# Patient Record
Sex: Female | Born: 1955 | Race: Black or African American | Hispanic: No | Marital: Married | Smoking: Current every day smoker
Health system: Southern US, Community
[De-identification: ages and names within clinical notes are randomized; demographics above are authoritative.]

## PROBLEM LIST (undated history)

## (undated) DIAGNOSIS — F32A Depression, unspecified: Secondary | ICD-10-CM

## (undated) DIAGNOSIS — F1910 Other psychoactive substance abuse, uncomplicated: Secondary | ICD-10-CM

## (undated) DIAGNOSIS — I779 Disorder of arteries and arterioles, unspecified: Secondary | ICD-10-CM

## (undated) DIAGNOSIS — I10 Essential (primary) hypertension: Secondary | ICD-10-CM

## (undated) DIAGNOSIS — N183 Chronic kidney disease, stage 3 unspecified: Secondary | ICD-10-CM

## (undated) DIAGNOSIS — R531 Weakness: Secondary | ICD-10-CM

## (undated) DIAGNOSIS — R42 Dizziness and giddiness: Secondary | ICD-10-CM

## (undated) DIAGNOSIS — F419 Anxiety disorder, unspecified: Secondary | ICD-10-CM

## (undated) DIAGNOSIS — F1010 Alcohol abuse, uncomplicated: Secondary | ICD-10-CM

## (undated) DIAGNOSIS — Z720 Tobacco use: Secondary | ICD-10-CM

## (undated) DIAGNOSIS — E785 Hyperlipidemia, unspecified: Secondary | ICD-10-CM

## (undated) HISTORY — PX: ABDOMINAL HYSTERECTOMY: SHX81

## (undated) HISTORY — PX: TOTAL SHOULDER ARTHROPLASTY: SHX126

## (undated) HISTORY — PX: SHOULDER ARTHROSCOPY W/ ROTATOR CUFF REPAIR: SHX2400

## (undated) HISTORY — PX: OTHER SURGICAL HISTORY: SHX169

## (undated) HISTORY — DX: Hyperlipidemia, unspecified: E78.5

---

## 2004-06-03 ENCOUNTER — Ambulatory Visit: Payer: Self-pay

## 2005-08-19 ENCOUNTER — Other Ambulatory Visit: Payer: Self-pay

## 2005-08-19 ENCOUNTER — Emergency Department: Payer: Self-pay | Admitting: Unknown Physician Specialty

## 2005-09-14 ENCOUNTER — Emergency Department: Payer: Self-pay | Admitting: Emergency Medicine

## 2006-01-28 ENCOUNTER — Emergency Department: Payer: Self-pay | Admitting: Emergency Medicine

## 2006-03-09 ENCOUNTER — Other Ambulatory Visit: Payer: Self-pay

## 2006-03-09 ENCOUNTER — Observation Stay: Payer: Self-pay | Admitting: Internal Medicine

## 2006-03-10 ENCOUNTER — Other Ambulatory Visit: Payer: Self-pay

## 2006-04-11 ENCOUNTER — Emergency Department: Payer: Self-pay | Admitting: Emergency Medicine

## 2006-06-17 ENCOUNTER — Emergency Department: Payer: Self-pay | Admitting: Emergency Medicine

## 2006-10-23 ENCOUNTER — Other Ambulatory Visit: Payer: Self-pay

## 2006-10-23 ENCOUNTER — Emergency Department: Payer: Self-pay | Admitting: Emergency Medicine

## 2007-09-07 ENCOUNTER — Emergency Department: Payer: Self-pay | Admitting: Emergency Medicine

## 2007-09-07 ENCOUNTER — Other Ambulatory Visit: Payer: Self-pay

## 2007-11-21 ENCOUNTER — Emergency Department: Payer: Self-pay | Admitting: Internal Medicine

## 2008-05-26 ENCOUNTER — Inpatient Hospital Stay: Payer: Self-pay | Admitting: Internal Medicine

## 2008-07-09 ENCOUNTER — Ambulatory Visit: Payer: Self-pay | Admitting: Family Medicine

## 2009-08-19 ENCOUNTER — Ambulatory Visit: Payer: Self-pay | Admitting: Obstetrics and Gynecology

## 2010-02-19 ENCOUNTER — Emergency Department: Payer: Self-pay | Admitting: Unknown Physician Specialty

## 2010-11-06 ENCOUNTER — Emergency Department: Payer: Self-pay | Admitting: Emergency Medicine

## 2010-11-27 ENCOUNTER — Ambulatory Visit: Payer: Self-pay | Admitting: Surgery

## 2011-03-08 ENCOUNTER — Emergency Department: Payer: Self-pay | Admitting: Internal Medicine

## 2011-07-18 ENCOUNTER — Emergency Department: Payer: Self-pay | Admitting: Emergency Medicine

## 2011-07-18 LAB — COMPREHENSIVE METABOLIC PANEL
Albumin: 4.1 g/dL (ref 3.4–5.0)
Anion Gap: 2 — ABNORMAL LOW (ref 7–16)
BUN: 16 mg/dL (ref 7–18)
Bilirubin,Total: 0.3 mg/dL (ref 0.2–1.0)
Calcium, Total: 9.5 mg/dL (ref 8.5–10.1)
Chloride: 109 mmol/L — ABNORMAL HIGH (ref 98–107)
Creatinine: 0.64 mg/dL (ref 0.60–1.30)
Glucose: 95 mg/dL (ref 65–99)
Osmolality: 280 (ref 275–301)
Potassium: 4.9 mmol/L (ref 3.5–5.1)
SGOT(AST): 15 U/L (ref 15–37)
SGPT (ALT): 15 U/L
Sodium: 140 mmol/L (ref 136–145)
Total Protein: 7.8 g/dL (ref 6.4–8.2)

## 2011-07-18 LAB — CBC
MCH: 32 pg (ref 26.0–34.0)
MCHC: 33.7 g/dL (ref 32.0–36.0)
MCV: 95 fL (ref 80–100)
Platelet: 247 10*3/uL (ref 150–440)
RBC: 4.57 10*6/uL (ref 3.80–5.20)
WBC: 6.9 10*3/uL (ref 3.6–11.0)

## 2011-07-18 LAB — TROPONIN I: Troponin-I: <0.02 ng/mL

## 2011-09-30 ENCOUNTER — Emergency Department: Payer: Self-pay | Admitting: Emergency Medicine

## 2012-03-17 ENCOUNTER — Emergency Department: Payer: Self-pay | Admitting: Emergency Medicine

## 2012-06-16 ENCOUNTER — Emergency Department: Payer: Self-pay | Admitting: Internal Medicine

## 2012-06-25 ENCOUNTER — Inpatient Hospital Stay: Payer: Self-pay | Admitting: Psychiatry

## 2012-06-25 LAB — DRUG SCREEN, URINE
Amphetamines, Ur Screen: NEGATIVE (ref ?–1000)
Cocaine Metabolite,Ur ~~LOC~~: NEGATIVE (ref ?–300)
MDMA (Ecstasy)Ur Screen: NEGATIVE (ref ?–500)
Methadone, Ur Screen: NEGATIVE (ref ?–300)
Opiate, Ur Screen: POSITIVE (ref ?–300)
Phencyclidine (PCP) Ur S: NEGATIVE (ref ?–25)
Tricyclic, Ur Screen: NEGATIVE (ref ?–1000)

## 2012-06-25 LAB — CBC
HCT: 42.2 % (ref 35.0–47.0)
HGB: 14.5 g/dL (ref 12.0–16.0)
MCV: 92 fL (ref 80–100)
Platelet: 281 10*3/uL (ref 150–440)
RBC: 4.6 10*6/uL (ref 3.80–5.20)
WBC: 8.5 10*3/uL (ref 3.6–11.0)

## 2012-06-25 LAB — COMPREHENSIVE METABOLIC PANEL
Alkaline Phosphatase: 77 U/L (ref 50–136)
Anion Gap: 5 — ABNORMAL LOW (ref 7–16)
BUN: 8 mg/dL (ref 7–18)
Co2: 28 mmol/L (ref 21–32)
EGFR (Non-African Amer.): >60
Glucose: 96 mg/dL (ref 65–99)
Osmolality: 278 (ref 275–301)
SGOT(AST): 21 U/L (ref 15–37)
SGPT (ALT): 10 U/L — ABNORMAL LOW (ref 12–78)

## 2012-06-25 LAB — SALICYLATE LEVEL
Salicylates, Serum: 6.4 mg/dL — ABNORMAL HIGH
Salicylates, Serum: 6.2 mg/dL — ABNORMAL HIGH

## 2012-06-25 LAB — ETHANOL
Ethanol %: <0.003 % (ref 0.000–0.080)
Ethanol: <3 mg/dL

## 2012-06-25 LAB — URINALYSIS, COMPLETE
Bilirubin,UR: NEGATIVE
Leukocyte Esterase: NEGATIVE
Protein: NEGATIVE
RBC,UR: 3 /HPF (ref 0–5)
Specific Gravity: 1.015 (ref 1.003–1.030)
WBC UR: 2 /HPF (ref 0–5)

## 2012-06-25 LAB — TSH: Thyroid Stimulating Horm: 3.14 u[IU]/mL

## 2012-06-25 LAB — ACETAMINOPHEN LEVEL: Acetaminophen: 8 ug/mL — ABNORMAL LOW

## 2012-11-18 ENCOUNTER — Emergency Department: Payer: Self-pay | Admitting: Emergency Medicine

## 2012-12-21 ENCOUNTER — Observation Stay: Payer: Self-pay | Admitting: Internal Medicine

## 2012-12-21 LAB — COMPREHENSIVE METABOLIC PANEL WITH GFR
Albumin: 4.2 g/dL
Alkaline Phosphatase: 66 U/L
Anion Gap: 4 — ABNORMAL LOW
BUN: 10 mg/dL
Bilirubin,Total: 0.5 mg/dL
Calcium, Total: 10 mg/dL
Chloride: 106 mmol/L
Co2: 27 mmol/L
Creatinine: 0.73 mg/dL
EGFR (African American): >60
EGFR (Non-African Amer.): >60
Glucose: 157 mg/dL — ABNORMAL HIGH
Osmolality: 276
Potassium: 3.5 mmol/L
SGOT(AST): 22 U/L
SGPT (ALT): 14 U/L
Sodium: 137 mmol/L
Total Protein: 7.8 g/dL

## 2012-12-21 LAB — URINALYSIS, COMPLETE
Bacteria: NONE SEEN
Bilirubin,UR: NEGATIVE
Glucose,UR: NEGATIVE mg/dL
Ketone: NEGATIVE
Leukocyte Esterase: NEGATIVE
Nitrite: NEGATIVE
Ph: 5
Protein: NEGATIVE
RBC,UR: 1 /HPF
Specific Gravity: 1.012
Squamous Epithelial: 1
WBC UR: 1 /HPF

## 2012-12-21 LAB — PROTIME-INR
INR: 0.9
Prothrombin Time: 12.5 s

## 2012-12-21 LAB — APTT: Activated PTT: 33 secs (ref 23.6–35.9)

## 2012-12-21 LAB — CBC
HCT: 43 %
HGB: 15.1 g/dL
MCH: 32.3 pg
MCHC: 35.2 g/dL
MCV: 92 fL
Platelet: 260 x10 3/mm 3
RBC: 4.7 X10 6/mm 3
RDW: 15.7 % — ABNORMAL HIGH
WBC: 7.5 x10 3/mm 3

## 2012-12-21 LAB — SEDIMENTATION RATE: Erythrocyte Sed Rate: 9 mm/h

## 2012-12-21 LAB — TROPONIN I: Troponin-I: <0.02 ng/mL

## 2012-12-22 DIAGNOSIS — I6350 Cerebral infarction due to unspecified occlusion or stenosis of unspecified cerebral artery: Secondary | ICD-10-CM

## 2012-12-22 LAB — LIPID PANEL
Cholesterol: 244 mg/dL — ABNORMAL HIGH (ref 0–200)
HDL Cholesterol: 71 mg/dL — ABNORMAL HIGH (ref 40–60)
Triglycerides: 74 mg/dL (ref 0–200)
VLDL Cholesterol, Calc: 15 mg/dL (ref 5–40)

## 2012-12-22 LAB — CBC WITH DIFFERENTIAL/PLATELET
Basophil #: 0 10*3/uL (ref 0.0–0.1)
Basophil %: 0.4 %
Eosinophil %: 1.8 %
HCT: 41.7 % (ref 35.0–47.0)
HGB: 14.4 g/dL (ref 12.0–16.0)
Lymphocyte #: 2.4 10*3/uL (ref 1.0–3.6)
MCH: 32 pg (ref 26.0–34.0)
MCHC: 34.5 g/dL (ref 32.0–36.0)
MCV: 93 fL (ref 80–100)
Neutrophil #: 3.4 10*3/uL (ref 1.4–6.5)
Neutrophil %: 53.5 %
Platelet: 249 10*3/uL (ref 150–440)
RDW: 15.6 % — ABNORMAL HIGH (ref 11.5–14.5)
WBC: 6.4 10*3/uL (ref 3.6–11.0)

## 2012-12-22 LAB — BASIC METABOLIC PANEL
Anion Gap: 6 — ABNORMAL LOW (ref 7–16)
BUN: 10 mg/dL (ref 7–18)
EGFR (Non-African Amer.): >60
Potassium: 3.8 mmol/L (ref 3.5–5.1)
Sodium: 138 mmol/L (ref 136–145)

## 2012-12-22 LAB — MAGNESIUM: Magnesium: 1.7 mg/dL — ABNORMAL LOW

## 2012-12-22 LAB — TSH: Thyroid Stimulating Horm: 1.85 u[IU]/mL

## 2012-12-22 LAB — HEMOGLOBIN A1C: Hemoglobin A1C: 4.9 % (ref 4.2–6.3)

## 2013-03-05 ENCOUNTER — Emergency Department: Payer: Self-pay | Admitting: Emergency Medicine

## 2013-03-05 LAB — URINALYSIS, COMPLETE
Bilirubin,UR: NEGATIVE
Glucose,UR: NEGATIVE mg/dL (ref 0–75)
Nitrite: NEGATIVE
Ph: 6 (ref 4.5–8.0)
Protein: NEGATIVE
RBC,UR: 4 /HPF (ref 0–5)
Squamous Epithelial: 2
WBC UR: 8 /HPF (ref 0–5)

## 2013-03-05 LAB — COMPREHENSIVE METABOLIC PANEL
Albumin: 4.2 g/dL (ref 3.4–5.0)
Alkaline Phosphatase: 67 U/L
Calcium, Total: 9.2 mg/dL (ref 8.5–10.1)
Chloride: 104 mmol/L (ref 98–107)
Co2: 33 mmol/L — ABNORMAL HIGH (ref 21–32)
Creatinine: 0.84 mg/dL (ref 0.60–1.30)
EGFR (African American): >60
EGFR (Non-African Amer.): >60
Osmolality: 271 (ref 275–301)
Potassium: 3.9 mmol/L (ref 3.5–5.1)
SGOT(AST): 32 U/L (ref 15–37)
SGPT (ALT): 30 U/L (ref 12–78)
Total Protein: 7.9 g/dL (ref 6.4–8.2)

## 2013-03-05 LAB — CBC WITH DIFFERENTIAL/PLATELET
Basophil #: 0 10*3/uL (ref 0.0–0.1)
Basophil %: 0.7 %
Eosinophil #: 0.1 10*3/uL (ref 0.0–0.7)
Eosinophil %: 1.8 %
HCT: 40.7 % (ref 35.0–47.0)
HGB: 14 g/dL (ref 12.0–16.0)
Lymphocyte #: 2.4 10*3/uL (ref 1.0–3.6)
MCH: 32.2 pg (ref 26.0–34.0)
MCHC: 34.4 g/dL (ref 32.0–36.0)
MCV: 94 fL (ref 80–100)
Monocyte %: 3.7 %
Neutrophil %: 61.4 %
Platelet: 292 10*3/uL (ref 150–440)

## 2013-05-31 ENCOUNTER — Emergency Department: Payer: Self-pay | Admitting: Internal Medicine

## 2013-05-31 LAB — COMPREHENSIVE METABOLIC PANEL
ALK PHOS: 78 U/L
ANION GAP: 5 — AB (ref 7–16)
AST: 11 U/L — AB (ref 15–37)
Albumin: 3.8 g/dL (ref 3.4–5.0)
BILIRUBIN TOTAL: 0.6 mg/dL (ref 0.2–1.0)
BUN: 11 mg/dL (ref 7–18)
Calcium, Total: 9.4 mg/dL (ref 8.5–10.1)
Chloride: 103 mmol/L (ref 98–107)
Co2: 28 mmol/L (ref 21–32)
Creatinine: 0.87 mg/dL (ref 0.60–1.30)
EGFR (African American): >60
EGFR (Non-African Amer.): >60
Glucose: 87 mg/dL (ref 65–99)
OSMOLALITY: 271 (ref 275–301)
Potassium: 3.4 mmol/L — ABNORMAL LOW (ref 3.5–5.1)
SGPT (ALT): 13 U/L (ref 12–78)
SODIUM: 136 mmol/L (ref 136–145)
Total Protein: 8.2 g/dL (ref 6.4–8.2)

## 2013-05-31 LAB — URINALYSIS, COMPLETE
Bilirubin,UR: NEGATIVE
Glucose,UR: NEGATIVE mg/dL (ref 0–75)
Nitrite: NEGATIVE
Ph: 5 (ref 4.5–8.0)
RBC,UR: 64 /HPF (ref 0–5)
Specific Gravity: 1.016 (ref 1.003–1.030)

## 2013-05-31 LAB — CBC
HCT: 42.4 % (ref 35.0–47.0)
HGB: 14.3 g/dL (ref 12.0–16.0)
MCH: 31.3 pg (ref 26.0–34.0)
MCHC: 33.7 g/dL (ref 32.0–36.0)
MCV: 93 fL (ref 80–100)
Platelet: 253 10*3/uL (ref 150–440)
RBC: 4.57 10*6/uL (ref 3.80–5.20)
RDW: 15.1 % — AB (ref 11.5–14.5)
WBC: 8.7 10*3/uL (ref 3.6–11.0)

## 2013-06-07 ENCOUNTER — Emergency Department: Payer: Self-pay | Admitting: Emergency Medicine

## 2013-06-07 LAB — URINALYSIS, COMPLETE
Bilirubin,UR: NEGATIVE
Blood: NEGATIVE
GLUCOSE, UR: NEGATIVE mg/dL (ref 0–75)
KETONE: NEGATIVE
Leukocyte Esterase: NEGATIVE
NITRITE: NEGATIVE
PH: 5 (ref 4.5–8.0)
Protein: NEGATIVE
RBC,UR: 1 /HPF (ref 0–5)
SPECIFIC GRAVITY: 1.01 (ref 1.003–1.030)
Squamous Epithelial: 2

## 2013-06-07 LAB — COMPREHENSIVE METABOLIC PANEL
ALBUMIN: 3.7 g/dL (ref 3.4–5.0)
ALT: 13 U/L (ref 12–78)
ANION GAP: 6 — AB (ref 7–16)
Alkaline Phosphatase: 78 U/L
BILIRUBIN TOTAL: 0.3 mg/dL (ref 0.2–1.0)
BUN: 16 mg/dL (ref 7–18)
CO2: 27 mmol/L (ref 21–32)
CREATININE: 0.87 mg/dL (ref 0.60–1.30)
Calcium, Total: 9.4 mg/dL (ref 8.5–10.1)
Chloride: 104 mmol/L (ref 98–107)
EGFR (African American): >60
Glucose: 78 mg/dL (ref 65–99)
OSMOLALITY: 274 (ref 275–301)
Potassium: 4.1 mmol/L (ref 3.5–5.1)
SGOT(AST): 14 U/L — ABNORMAL LOW (ref 15–37)
Sodium: 137 mmol/L (ref 136–145)
Total Protein: 7.9 g/dL (ref 6.4–8.2)

## 2013-06-07 LAB — CBC
HCT: 40.5 % (ref 35.0–47.0)
HGB: 13.6 g/dL (ref 12.0–16.0)
MCH: 31.1 pg (ref 26.0–34.0)
MCHC: 33.7 g/dL (ref 32.0–36.0)
MCV: 92 fL (ref 80–100)
Platelet: 307 10*3/uL (ref 150–440)
RBC: 4.38 10*6/uL (ref 3.80–5.20)
RDW: 14.7 % — ABNORMAL HIGH (ref 11.5–14.5)
WBC: 8.2 10*3/uL (ref 3.6–11.0)

## 2013-07-28 ENCOUNTER — Emergency Department: Payer: Self-pay | Admitting: Emergency Medicine

## 2013-07-28 ENCOUNTER — Inpatient Hospital Stay (HOSPITAL_COMMUNITY): Admission: AD | Admit: 2013-07-28 | Payer: Self-pay | Admitting: Psychiatry

## 2013-07-28 LAB — URINALYSIS, COMPLETE
Bilirubin,UR: NEGATIVE
GLUCOSE, UR: NEGATIVE mg/dL (ref 0–75)
Ketone: NEGATIVE
Leukocyte Esterase: NEGATIVE
NITRITE: NEGATIVE
Ph: 6 (ref 4.5–8.0)
Protein: NEGATIVE
RBC, UR: NONE SEEN /HPF (ref 0–5)
Specific Gravity: 1.003 (ref 1.003–1.030)
WBC UR: 1 /HPF (ref 0–5)

## 2013-07-28 LAB — CBC
HCT: 42.5 % (ref 35.0–47.0)
HGB: 14.1 g/dL (ref 12.0–16.0)
MCH: 30.9 pg (ref 26.0–34.0)
MCHC: 33.2 g/dL (ref 32.0–36.0)
MCV: 93 fL (ref 80–100)
PLATELETS: 245 10*3/uL (ref 150–440)
RBC: 4.56 10*6/uL (ref 3.80–5.20)
RDW: 16 % — AB (ref 11.5–14.5)
WBC: 5.8 10*3/uL (ref 3.6–11.0)

## 2013-07-28 LAB — DRUG SCREEN, URINE

## 2013-07-28 LAB — COMPREHENSIVE METABOLIC PANEL
ALK PHOS: 65 U/L
AST: 27 U/L (ref 15–37)
Albumin: 4 g/dL (ref 3.4–5.0)
Anion Gap: 6 — ABNORMAL LOW (ref 7–16)
BUN: 10 mg/dL (ref 7–18)
Bilirubin,Total: 0.3 mg/dL (ref 0.2–1.0)
CALCIUM: 9.5 mg/dL (ref 8.5–10.1)
Chloride: 106 mmol/L (ref 98–107)
Co2: 27 mmol/L (ref 21–32)
Creatinine: 0.8 mg/dL (ref 0.60–1.30)
EGFR (African American): >60
EGFR (Non-African Amer.): >60
Glucose: 79 mg/dL (ref 65–99)
Osmolality: 276 (ref 275–301)
Potassium: 3.8 mmol/L (ref 3.5–5.1)
SGPT (ALT): 15 U/L (ref 12–78)
SODIUM: 139 mmol/L (ref 136–145)
TOTAL PROTEIN: 7.6 g/dL (ref 6.4–8.2)

## 2013-07-28 LAB — TSH: Thyroid Stimulating Horm: 1.35 u[IU]/mL

## 2013-07-28 LAB — SALICYLATE LEVEL: Salicylates, Serum: 3.6 mg/dL — ABNORMAL HIGH

## 2013-07-28 LAB — ETHANOL
ETHANOL LVL: 129 mg/dL
Ethanol %: 0.129 % — ABNORMAL HIGH (ref 0.000–0.080)

## 2013-07-28 LAB — ACETAMINOPHEN LEVEL

## 2013-09-03 LAB — URINALYSIS, COMPLETE
Bacteria: NONE SEEN
Bilirubin,UR: NEGATIVE
Glucose,UR: NEGATIVE mg/dL (ref 0–75)
Ketone: NEGATIVE
LEUKOCYTE ESTERASE: NEGATIVE
Nitrite: NEGATIVE
Ph: 5 (ref 4.5–8.0)
Protein: NEGATIVE
SPECIFIC GRAVITY: 1.015 (ref 1.003–1.030)
Squamous Epithelial: 3

## 2013-09-03 LAB — PROTIME-INR
INR: 0.9
Prothrombin Time: 11.9 secs (ref 11.5–14.7)

## 2013-09-03 LAB — CBC WITH DIFFERENTIAL/PLATELET
BASOS PCT: 0.5 %
Basophil #: 0 10*3/uL (ref 0.0–0.1)
Eosinophil #: 0.2 10*3/uL (ref 0.0–0.7)
Eosinophil %: 3 %
HCT: 43.4 % (ref 35.0–47.0)
HGB: 14.9 g/dL (ref 12.0–16.0)
LYMPHS PCT: 39.2 %
Lymphocyte #: 3 10*3/uL (ref 1.0–3.6)
MCH: 31.6 pg (ref 26.0–34.0)
MCHC: 34.3 g/dL (ref 32.0–36.0)
MCV: 92 fL (ref 80–100)
Monocyte #: 0.3 x10 3/mm (ref 0.2–0.9)
Monocyte %: 3.9 %
Neutrophil #: 4.1 10*3/uL (ref 1.4–6.5)
Neutrophil %: 53.4 %
PLATELETS: 287 10*3/uL (ref 150–440)
RBC: 4.7 10*6/uL (ref 3.80–5.20)
RDW: 16.4 % — AB (ref 11.5–14.5)
WBC: 7.7 10*3/uL (ref 3.6–11.0)

## 2013-09-03 LAB — COMPREHENSIVE METABOLIC PANEL
ALK PHOS: 72 U/L
ALT: 16 U/L (ref 12–78)
ANION GAP: 9 (ref 7–16)
AST: 19 U/L (ref 15–37)
Albumin: 4.1 g/dL (ref 3.4–5.0)
BILIRUBIN TOTAL: 0.3 mg/dL (ref 0.2–1.0)
BUN: 16 mg/dL (ref 7–18)
CALCIUM: 9.5 mg/dL (ref 8.5–10.1)
Chloride: 105 mmol/L (ref 98–107)
Co2: 24 mmol/L (ref 21–32)
Creatinine: 0.69 mg/dL (ref 0.60–1.30)
EGFR (Non-African Amer.): >60
Glucose: 94 mg/dL (ref 65–99)
Osmolality: 277 (ref 275–301)
POTASSIUM: 3.3 mmol/L — AB (ref 3.5–5.1)
SODIUM: 138 mmol/L (ref 136–145)
Total Protein: 7.9 g/dL (ref 6.4–8.2)

## 2013-09-03 LAB — ETHANOL
Ethanol %: 0.07 % (ref 0.000–0.080)
Ethanol: 70 mg/dL

## 2013-09-03 LAB — SALICYLATE LEVEL: Salicylates, Serum: 5.8 mg/dL — ABNORMAL HIGH

## 2013-09-03 LAB — TROPONIN I: Troponin-I: <0.02 ng/mL

## 2013-09-04 ENCOUNTER — Observation Stay: Payer: Self-pay | Admitting: Internal Medicine

## 2013-09-04 LAB — DRUG SCREEN, URINE
Amphetamines, Ur Screen: NEGATIVE (ref ?–1000)
BARBITURATES, UR SCREEN: NEGATIVE (ref ?–200)
Benzodiazepine, Ur Scrn: NEGATIVE (ref ?–200)
Cannabinoid 50 Ng, Ur ~~LOC~~: NEGATIVE (ref ?–50)
Cocaine Metabolite,Ur ~~LOC~~: NEGATIVE (ref ?–300)
MDMA (ECSTASY) UR SCREEN: NEGATIVE (ref ?–500)
Methadone, Ur Screen: NEGATIVE (ref ?–300)
Opiate, Ur Screen: NEGATIVE (ref ?–300)
PHENCYCLIDINE (PCP) UR S: NEGATIVE (ref ?–25)
Tricyclic, Ur Screen: NEGATIVE (ref ?–1000)

## 2013-09-04 LAB — TSH: THYROID STIMULATING HORM: 2.6 u[IU]/mL

## 2013-09-04 LAB — ACETAMINOPHEN LEVEL

## 2014-01-20 LAB — CBC
HCT: 46.9 % (ref 35.0–47.0)
HGB: 15.6 g/dL (ref 12.0–16.0)
MCH: 32.5 pg (ref 26.0–34.0)
MCHC: 33.2 g/dL (ref 32.0–36.0)
MCV: 98 fL (ref 80–100)
Platelet: 243 10*3/uL (ref 150–440)
RBC: 4.8 10*6/uL (ref 3.80–5.20)
RDW: 16.3 % — AB (ref 11.5–14.5)
WBC: 5.5 10*3/uL (ref 3.6–11.0)

## 2014-01-20 LAB — COMPREHENSIVE METABOLIC PANEL
ALK PHOS: 53 U/L
AST: 32 U/L (ref 15–37)
Albumin: 4.2 g/dL (ref 3.4–5.0)
Anion Gap: 6 — ABNORMAL LOW (ref 7–16)
BUN: 9 mg/dL (ref 7–18)
Bilirubin,Total: 0.2 mg/dL (ref 0.2–1.0)
CHLORIDE: 107 mmol/L (ref 98–107)
CO2: 24 mmol/L (ref 21–32)
Calcium, Total: 9.3 mg/dL (ref 8.5–10.1)
Creatinine: 0.69 mg/dL (ref 0.60–1.30)
EGFR (African American): >60
EGFR (Non-African Amer.): >60
GLUCOSE: 78 mg/dL (ref 65–99)
OSMOLALITY: 271 (ref 275–301)
Potassium: 3.6 mmol/L (ref 3.5–5.1)
SGPT (ALT): 27 U/L
Sodium: 137 mmol/L (ref 136–145)
Total Protein: 8 g/dL (ref 6.4–8.2)

## 2014-01-20 LAB — URINALYSIS, COMPLETE
BILIRUBIN, UR: NEGATIVE
Bacteria: NONE SEEN
Glucose,UR: NEGATIVE mg/dL (ref 0–75)
Ketone: NEGATIVE
Nitrite: NEGATIVE
PH: 5 (ref 4.5–8.0)
Protein: NEGATIVE
RBC,UR: 2 /HPF (ref 0–5)
SPECIFIC GRAVITY: 1.011 (ref 1.003–1.030)
WBC UR: 7 /HPF (ref 0–5)

## 2014-01-20 LAB — ACETAMINOPHEN LEVEL: Acetaminophen: <2 ug/mL

## 2014-01-20 LAB — DRUG SCREEN, URINE
AMPHETAMINES, UR SCREEN: NEGATIVE (ref ?–1000)
BARBITURATES, UR SCREEN: NEGATIVE (ref ?–200)
Benzodiazepine, Ur Scrn: NEGATIVE (ref ?–200)
CANNABINOID 50 NG, UR ~~LOC~~: POSITIVE (ref ?–50)
COCAINE METABOLITE, UR ~~LOC~~: NEGATIVE (ref ?–300)
MDMA (ECSTASY) UR SCREEN: NEGATIVE (ref ?–500)
Methadone, Ur Screen: NEGATIVE (ref ?–300)
Opiate, Ur Screen: NEGATIVE (ref ?–300)
Phencyclidine (PCP) Ur S: NEGATIVE (ref ?–25)
Tricyclic, Ur Screen: NEGATIVE (ref ?–1000)

## 2014-01-20 LAB — SALICYLATE LEVEL: Salicylates, Serum: 6.9 mg/dL — ABNORMAL HIGH

## 2014-01-20 LAB — ETHANOL: ETHANOL LVL: 18 mg/dL

## 2014-01-21 ENCOUNTER — Inpatient Hospital Stay: Payer: Self-pay | Admitting: Psychiatry

## 2014-03-21 LAB — URINALYSIS, COMPLETE
BILIRUBIN, UR: NEGATIVE
Bacteria: NONE SEEN
Glucose,UR: NEGATIVE mg/dL (ref 0–75)
Ketone: NEGATIVE
LEUKOCYTE ESTERASE: NEGATIVE
Nitrite: NEGATIVE
Ph: 5 (ref 4.5–8.0)
Protein: NEGATIVE
Specific Gravity: 1.001 (ref 1.003–1.030)
Squamous Epithelial: <1
WBC UR: NONE SEEN /HPF (ref 0–5)

## 2014-03-21 LAB — COMPREHENSIVE METABOLIC PANEL
ANION GAP: 8 (ref 7–16)
Albumin: 3.8 g/dL (ref 3.4–5.0)
Alkaline Phosphatase: 68 U/L
BUN: 8 mg/dL (ref 7–18)
Bilirubin,Total: 0.5 mg/dL (ref 0.2–1.0)
CALCIUM: 8.9 mg/dL (ref 8.5–10.1)
CHLORIDE: 104 mmol/L (ref 98–107)
Co2: 23 mmol/L (ref 21–32)
Creatinine: 0.76 mg/dL (ref 0.60–1.30)
EGFR (Non-African Amer.): >60
Glucose: 72 mg/dL (ref 65–99)
OSMOLALITY: 267 (ref 275–301)
POTASSIUM: 4 mmol/L (ref 3.5–5.1)
SGOT(AST): 30 U/L (ref 15–37)
SGPT (ALT): 17 U/L
Sodium: 135 mmol/L — ABNORMAL LOW (ref 136–145)
Total Protein: 7.4 g/dL (ref 6.4–8.2)

## 2014-03-21 LAB — CBC
HCT: 45 % (ref 35.0–47.0)
HGB: 15.1 g/dL (ref 12.0–16.0)
MCH: 32.4 pg (ref 26.0–34.0)
MCHC: 33.5 g/dL (ref 32.0–36.0)
MCV: 97 fL (ref 80–100)
PLATELETS: 269 10*3/uL (ref 150–440)
RBC: 4.66 10*6/uL (ref 3.80–5.20)
RDW: 15.7 % — AB (ref 11.5–14.5)
WBC: 7.1 10*3/uL (ref 3.6–11.0)

## 2014-03-21 LAB — DRUG SCREEN, URINE
Amphetamines, Ur Screen: NEGATIVE (ref ?–1000)
BARBITURATES, UR SCREEN: NEGATIVE (ref ?–200)
Benzodiazepine, Ur Scrn: NEGATIVE (ref ?–200)
CANNABINOID 50 NG, UR ~~LOC~~: POSITIVE (ref ?–50)
COCAINE METABOLITE, UR ~~LOC~~: NEGATIVE (ref ?–300)
MDMA (Ecstasy)Ur Screen: NEGATIVE (ref ?–500)
Methadone, Ur Screen: NEGATIVE (ref ?–300)
Opiate, Ur Screen: NEGATIVE (ref ?–300)
PHENCYCLIDINE (PCP) UR S: NEGATIVE (ref ?–25)
Tricyclic, Ur Screen: NEGATIVE (ref ?–1000)

## 2014-03-21 LAB — SALICYLATE LEVEL: SALICYLATES, SERUM: 6.2 mg/dL — AB

## 2014-03-21 LAB — ACETAMINOPHEN LEVEL: Acetaminophen: <2 ug/mL

## 2014-03-21 LAB — ETHANOL: ETHANOL LVL: 192 mg/dL

## 2014-03-22 ENCOUNTER — Inpatient Hospital Stay: Payer: Self-pay | Admitting: Psychiatry

## 2014-06-27 NOTE — Discharge Summary (Signed)
PATIENT NAME:  Erin Lewis, Erin Lewis MR#:  [id number] DATE OF BIRTH:  12/08/1955  DATE OF ADMISSION:  12/21/2012 DATE OF DISCHARGE:  12/23/2012  DISCHARGE DIAGNOSES:  1. Left side weakness, numbness, resolved.  2. Hypertension.  3. Hyperlipidemia.   DISCHARGE MEDICATIONS: Atorvastatin  20 mg p.o. daily, aspirin 325 mg p.o. daily, hydrochlorothiazide 25 mg p.o. daily. Norvasc 5 mg daily.   DIET: Low-sodium, low-fat diet.   CONSULTATIONS: None.   DISCHARGE: Home with home physical therapy.   HOSPITAL COURSE: The patient is a 59 year old female patient admitted because of left facial numbness and left-sided weakness. The patient had no dysphasia or slurred speech, had some headache. Blood pressure on admission 206/96, but decreased to 188/90 without treatment. She has a history of tobacco abuse of 20 years. The patient admitted for stroke evaluation. Initial CAT scan of the head is normal. Neurologic examination was normal, except some slight weakness on the left side. The patient had an MRI of the brain and carotid ultrasound and echocardiogram and lipid panel. Her LDL is 158. Echocardiogram showed EF of 60% to 65% with normal LV function. Hemoglobin A1c 4.9. MRI of the brain showed no abnormal signal to suggest any infarct. MRI is normal. The patient also had carotid ultrasound, which showed small amount of soft calcified plaque in carotid systems. No evidence of hemodynamically significant stenosis. The patient has CT head not showing any acute changes. CBC, BMP were normal. Troponins have been negative. The patient seen by physical therapy, they recommended 24-hour help.  The patient lives with her husband. The patient was seen by physical therapy and speech therapy. Speech therapy did not recommend any changes in her diet. Physical therapy recommended home with 24-hour assist. The patient says that she has a walker and cane at home. I told her that she needs to be careful about her fall risk,  and we are  going to arrange physical therapy at home.  Of note, her blood pressure was elevated yesterday afternoon and we could not arrange home physical therapy. Discharge was cancelled.  At that time, blood pressure was 223/105, which improved with Ativan.pt most likely had anixiety attack. The patient did not have to have any extra dose of hydralazine. The patient was given a dose of Ativan 0.5 and also nitroglycerin patch was started, 2 inches b.i.d., and the patient denies any complaints. The patient had a repeat of the CAT scan of the head yesterday in the evening because of numbness of left side of face, which resolved on its own. The patient's blood pressure this morning is 126/74, pulse is 103. Respirations were 18. Temperature 97.6. The patient was advised to quit smoking.     ____________________________ Epifanio Lesches, MD sk:sg D: 12/23/2012 09:18:43 ET T: 12/23/2012 10:51:12 ET JOB#: [id number]  cc: Epifanio Lesches, MD, <Dictator> Epifanio Lesches MD ELECTRONICALLY SIGNED 01/08/2013 13:31

## 2014-06-27 NOTE — H&P (Signed)
PATIENT NAME:  Erin Lewis, WATROUS MR#:  [id number] DATE OF BIRTH:  [birth year]  PRIMARY CARE PHYSICIAN:  Dr. Terrilee Files.  REFERRING PHYSICIAN:  Dr. Jacqualine Code CHIEF COMPLAINT:  Left facial numbness, left-sided weakness since this morning.  HISTORY OF PRESENT ILLNESS: A 59 year old [place] American female with a history of hypertension, presented to the ED with above chief complaint. The patient is alert, awake, oriented, in no acute distress. The patient stated that when she woke up this morning, she felt left facial numbness and left arm and leg weakness and numbness, which persisted until now.  The patient also complains of right eye blurry vision.  She feels something in the throat, but not obvious dysphagia or slurred speech. The patient complains of a headache and dizziness, but she denies any other symptoms. The patient lost weight for the past 1 month, but she does not know how many pounds. The patient was noted to have  high blood pressure to 206/96, but decreased to 188/99 without treatment.   PAST MEDICAL HISTORY: Hypertension.   SOCIAL HISTORY:  Smokes 1/2 pack a day for more than 20 years. Denies any alcohol drinking or illicit drugs.   FAMILY HISTORY: Hypertension, diabetes. No heart attack or stroke.   PAST SURGICAL HISTORY: C-section twice, rotator cuff surgeries on the right shoulder.    ALLERGIES: None.   HOME MEDICATIONS:  HCTZ 12.5 mg p.o. daily, aspirin 81 mg p.o. daily.    REVIEW OF SYSTEMS:   CONSTITUTIONAL: The patient denies any fever or chills, but has headache, dizziness and left-sided weakness and weight loss.  EYES: No double vision, but has right eye blurry vision.  HEAD, EARS, NOSE, THROAT: No postnasal drip, slurred speech or dysphagia.  CARDIOVASCULAR: No chest pain, palpitation, orthopnea or nocturnal dyspnea. No leg edema.  PULMONARY: No cough, sputum, shortness of breath or hemoptysis.  GASTROINTESTINAL: No abdominal pain, nausea, vomiting or diarrhea. No melena  or bloody stool.  GENITOURINARY: No dysuria, hematuria or incontinence.  SKIN: No rash or jaundice.  NEUROLOGY: No syncope, loss of consciousness or seizure, but has left facial numbness and left-sided weakness and numbness.  ENDOCRINE: No polyuria, polydipsia, heat or cold intolerance.  HEMATOLOGY: No easy bruising or bleeding.   PHYSICAL EXAMINATION: VITAL SIGNS: Temperature 98, blood pressure 188/99, pulse 58, O2 saturations 100% on room air.  GENERAL: The patient is alert, awake, oriented, in no acute distress.  HEENT  AND NECK: Pupils round, equal and reactive to light and accommodation.  Supple. No JVD or carotid bruits noted. No lymphadenopathy.  No thyromegaly.  Moist oral mucosa, clear oropharynx. CARDIOVASCULAR: S1, S2, regular rate and rhythm. No murmurs or gallops.  PULMONARY: Bilateral air entry. No wheezing or rales. No use of accessory muscle to breathe.  ABDOMEN: Soft. No distention or tenderness. No organomegaly. Bowel sounds present.  EXTREMITIES: No edema, clubbing or cyanosis. No calf tenderness. Strong bilateral pedal pulses.  SKIN: No rash or jaundice.  NEUROLOGIC: A and O x 3. No focal deficit. Power 5/5. Sensation intact. Mildly weaker on the left side than the right side, but power is 5/5.  DTRs 2+.   LABORATORY DATA: Urinalysis is negative. CAT SCAN OF HEAD: No acute intracranial abnormality.   CHEST X-RAY: No pneumonia or CHF.   CBC in normal range.   Glucose 157, BUN 10, creatinine 0.73. Electrolytes normal. Troponin less than 0.02. EKG showed sinus bradycardia at 58.   IMPRESSIONS: 1.  Possible cerebrovascular accident.  2.  Hypertension malignancy.  3.  Tobacco  abuse.   PLAN OF TREATMENT: 1.  The patient will be placed for observation. We will give aspirin 325 mg p.o. daily. The patient was given aspirin 300 mg by rectal in the ED. We will start statin, and we will get an MRI of brain, carotid duplex, echocardiogram. In addition, we will get a PT  evaluation and swallowing study.  2.  We will continue HCTZ.  3.  Smoking cessation was counseled for 5 minutes.   The patient is a FULL CODE.   I discussed the patient's condition and plan of treatment with the patient and the patient's sister.   TIME SPENT: About 46 minutes.    ____________________________ Demetrios Loll, MD qc:dmm D: 12/21/2012 12:07:00 ET T: 12/21/2012 12:27:47 ET JOB#: [id number]  cc: Demetrios Loll, MD, <Dictator> Demetrios Loll MD ELECTRONICALLY SIGNED 12/24/2012 12:34

## 2014-06-27 NOTE — H&P (Signed)
PATIENT NAME:  Erin, Lewis MR#:  [id number] DATE OF BIRTH:  08-22-1955  DATE OF ADMISSION:  06/25/2012  IDENTIFYING INFORMATION AND CHIEF COMPLAINT: A 59 year old woman who presented to the Emergency Room voluntarily stating that she was suicidal and needed help. The patient's chief complaint today, "I don't know."   HISTORY OF PRESENT ILLNESS: Information obtained from the patient, the chart and the patient's husband. The patient came to the Emergency Room yesterday and at that time nursing reports that she made several reports of having suicidal ideation and wanting to die. She was admitting at that time that she had been abusing narcotics and possibly benzodiazepines that she had gotten from the street and had reported that she was drinking heavily. On interview with me today, the patient is much less forthcoming about her symptoms. She does state that for the past month or so she has been feeling increasingly depressed. She feels tired most of the time. Stays in bed much of the day. Has been feeling increasingly hopeless and negative about herself. She does not sleep well at night. Her appetite has been decreased, and she has been losing weight. She admits that she has had occasional suicidal thoughts but denies that she has ever had any intent of carrying through on them. She blames much of her mood problem on the fact that her husband has been working very long hours and she rarely gets to see him. The patient tells me that she is taking prescribed pain medicine only and only using about 1 Vicodin per day. She says that she drinks occasionally but denies that she has been drinking heavily. Denies that she is buying any other drugs on the street. Information from her husband suggests that the patient has been extremely depressed recently and very much not herself. Very withdrawn, very negative and probably has been using excessive narcotics, although he is not aware that she has been drinking heavily.    PAST PSYCHIATRIC HISTORY: The patient has never been in a psychiatric hospital. Denies any history of mental health treatment or psychiatric medicine. Denies any suicide attempts in the past.   SUBSTANCE ABUSE HISTORY: The patient minimizes this today but does admit ultimately that she has been drinking a little bit more than usual but still says she drinks only about 1 glass of whiskey a day and only about 1 ounce at a time. She claims that she has only been taking narcotics that were prescribed for her.   MEDICAL HISTORY: The patient has chronic pain in her right shoulder. She has had several operations on her shoulder and says that the end result has been that she feels worse, has more pain and less mobility than she did before surgery. Her report is that when she declined her most recent offer of surgery from the orthopedic clinic she was attending, they stopped seeing her, at which point she no longer was able to get any narcotic pain medicine. The patient otherwise minimizes medical problems. It looks like in the past she has had chest pain which ruled out for an MI but does have hypertension but probably is not getting regular treatment for it.   FAMILY HISTORY: Knows of no family history of mental health problems.   SOCIAL HISTORY: The patient is married, has been married for about 30 years. Has adult children. She stays in reasonably close contact with her adult children. From what I can tell, she seems to still have a good relationship with her husband, although her  perception is that he is fed up with her depression. 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 She is very depressed about the fact that she is out of work and feels bored and useless all day.   CURRENT MEDICATIONS: It transpires that she was last prescribed any Vicodin back in December. Does not appear to be on any other prescription medicine  currently.   ALLERGIES: No known drug allergies.   REVIEW OF SYSTEMS: Depressed mood, fatigue, hopelessness, tearfulness, passive suicidal ideation. Denies auditory or visual hallucinations. Denies paranoia or any other psychotic symptoms. Complains of pain in her right shoulder, particularly when she tries to lift the shoulder higher than about 30 degrees.   MENTAL STATUS EXAM: Neatly groomed woman, looks her stated age. Cooperative with the interview but at least initially not very forthcoming. Eye contact intermittent. Psychomotor activity fidgety. Speech is normal in tone. Affect is anxious and dysphoric, later breaking down into tears as she becomes more forthcoming about her symptoms. Mood stated as being bad. Thoughts appear to be a bit scattered but not obviously disorganized or bizarre. Denies hallucinations. No obvious delusions. Passive suicidal ideation. No homicidal ideation. Judgment and insight somewhat impaired. Appears to be of normal intelligence. Intact short-term memory.   PHYSICAL EXAM:  GENERAL: The patient looks like she is probably underweight.  SKIN: No skin lesions identified.  HEENT: Pupils equal and reactive. She is complaining of having a broken blood vessel in her right eye, but I do not see anything that looks remarkably abnormal. Face is symmetric.  NECK AND BACK: Nontender.  EXTREMITIES: The patient has decreased range of motion at her right shoulder. Otherwise, normal range of motion in extremities.  NEUROLOGIC: Decreased strength, especially around shoulder motion in her right upper extremity. Otherwise, normal strength and reflexes symmetrically and normal symmetric cranial nerves.  LUNGS: Clear with no wheezes.  HEART: Regular rate and rhythm.  ABDOMEN: Soft, nontender. Normal bowel sounds.  CURRENT VITAL SIGNS: As of this evening show a blood pressure of 127/76, respirations 20, pulse 69, temperature 98.1.   LABORATORY RESULTS: Drug screen is positive for  cannabis and opiates. TSH normal at 3.1. Alcohol undetectable. Chemistry shows no significant abnormality. CBC unremarkable.   ASSESSMENT: A 59 year old woman who presents with full symptoms of major depression, complicated also by chronic pain and possible narcotic abuse as well as probable marijuana abuse at least intermittently. Needs hospitalization because of lack of outpatient treatment, potential dangers and potential suicidal ideation.   TREATMENT PLAN: Psychoeducation regarding depression and substance abuse and the treatment of both. Engage the patient in daily individual and group psychotherapy. I proposed to her that we start an antidepressant, beginning with fluoxetine 20 mg a day. Monitor vital signs in case she were to need any withdrawal medication. At this point, I have ordered p.r.n. low dose of Vicodin but anticipate probably discontinuing that tomorrow. The husband has requested that he be allowed to visit in the morning because of his work schedule, and I have gone ahead and put in an order for that.   DIAGNOSIS, PRINCIPAL AND PRIMARY:  AXIS I: Major depression, severe, single episode.   SECONDARY DIAGNOSES:  AXIS I:  1. Opiate dependence.  2. 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  AXIS II: Deferred.  AXIS III: Chronic pain.  AXIS IV: Severe from being unable to work, strained relationship with her husband.  AXIS V: Functioning at  time of evaluation 30.   ____________________________ Erin Lex, MD jtc:gb D: 06/26/2012 22:26:13 ET T: 06/26/2012 22:46:25 ET JOB#: [id number]  cc: Erin Lex, MD, <Dictator> Erin Lex MD ELECTRONICALLY SIGNED 06/28/2012 15:08

## 2014-06-27 NOTE — Discharge Summary (Signed)
PATIENT NAME:  Erin Lewis, Erin Lewis MR#:  [id number] DATE OF BIRTH:  07-02-1955  DATE OF ADMISSION:  06/25/2012   DATE OF DISCHARGE:  06/29/2012    HOSPITAL COURSE:  See dictated history and physical for details of admission. This 59 year old woman came to the hospital after expressing concerns about serious suicidal ideation. Multiple symptoms of major depression, also overusing narcotics and benzodiazepines. The patient was initially withdrawn, depressed and anxious. She responded to individual and group psychotherapy and psychoeducation, and has been treated with fluoxetine 20 mg a day for treatment of depression. She has not shown significant symptoms of opiate withdrawal. For the first couple of days, she continued on standing dose of low-dose Vicodin until we were able to discover that she did not have a provider in the community, at which point it was discontinued. She is not showing any physical withdrawal symptoms at this point. The patient denies any suicidal ideation and appears lucid and calm. She is agreeable to follow up, which will be arranged with an outpatient mental health provider for therapy and med management. The patient met with her husband several times in the hospital. She states that they intend to eventually get divorced, but they seem to be getting along well when he comes to visit. She is not expressing any current suicidal ideation and can state multiple positive things in her life that she has to live for. She had continued to complain of shoulder and hip pain, but is able to walk and use her arms appropriately. I had requested an orthopedic surgery consultation prior to discharge because of the patient's request that it be worked up, but at this point, she prefers to be discharged rather than to wait. I have advised her to follow up with her primary care doctor or her orthopedic surgeon, particularly if the pain symptoms do not improve appropriately in a short period of time. She  agrees to the treatment plan.   DISCHARGE MEDICATIONS:  Prozac 20 mg p.o. daily, aspirin 81 mg per day, ibuprofen 800 mg q.8 hours p.r.n. for pain, trazodone 50 mg at bedtime as needed for sleep.   RADIOLOGIC AND LABORATORY RESULTS: Admission labs showed drug screen positive for opiates and cannabis. TSH normal at 3.1. Alcohol undetected. Chemistry: No significant abnormalities. CBC unremarkable. Acetaminophen level nontoxic, below 10. Salicylates slightly elevated at 6, but nontoxic. Urinalysis unremarkable. Right hip x-ray shows no acute bony abnormality of the hip, and shoulder x-ray shows no abnormality, although there is a narrowing of the subacromial  subdeltoid space.   MENTAL STATUS EXAMINATION AT DISCHARGE:  Neatly dressed and groomed woman. Good eye contact. Normal psychomotor activity. Speech is normal in rate, tone and volume. Affect euthymic, reactive and appropriate. Mood stated as okay. Appears to be lucid. No loosening of associations, no delusions. Denies auditory or visual hallucinations. Denies suicidal or homicidal ideation. Shows good judgment and insight. Normal intelligence. Able to state multiple positive things in her life that are worth living for and agreeable to outpatient treatment.   DIAGNOSIS, PRINCIPAL AND PRIMARY:  AXIS I:  Major depressive episode, single episode, severe.   SECONDARY DIAGNOSES:  AXIS I:   1.  Opiate abuse.  2.  Cannabis abuse.  AXIS II:  Deferred.  AXIS III:  Chronic pain, history of vascular disease.  AXIS IV:  Severe from current break up with her husband and chronic pain.  AXIS V:  Functioning at time of discharge 60.      ____________________________ Gonzella Lex, MD  jtc:dmm D: 06/29/2012 11:46:51 ET T: 06/29/2012 12:32:25 ET JOB#: [id number]  cc: Gonzella Lex, MD, <Dictator> Gonzella Lex MD ELECTRONICALLY SIGNED 07/02/2012 22:59

## 2014-06-28 NOTE — Consult Note (Signed)
PATIENT NAME:  Erin Lewis, Erin Lewis MR#:  [id number] DATE OF BIRTH:  02/13/1956  DATE OF CONSULTATION:  07/28/2013  CONSULTING PHYSICIAN:  Dyson Sevey K. Franchot Mimes, MD  PRIORITY PLEASE  SEX: Female.  RACE: African American.  SUBJECTIVE: The patient was seen in consultation in [facility] ER at [facility]. The patient is a 59 year old African American female not employed and is married and lives with her husband "sometimes".   CHIEF COMPLAINT: The patient comes to [facility] by Emergency Room with a chief complaint of feeling depressed and having had a bad day and not feeling well.  PAST PSYCHIATRIC HISTORY: The patient had a history of overdose on pills in 2014 when she was inpatient at [facility]. The patient reports that she is being followed at [facility] and she relates well with the therapist and her doctor and has an appointment coming up in a few days.   ALCOHOL AND DRUGS: Denies.   MENTAL STATUS EXAMINATION: The patient is alert and oriented, fully aware of situation that brought her here. Affect is bright and better. She reports that she feels low and down but not as depressed. No psychosis. Denies auditory or visual hallucinations. Denies any ideas or plans to hurt herself or others. Contracts for safety. She is eager to go home and keep up her followup appointment with [facility]. The patient was informed that she was accepted for admission to [facility] but she denies and she does not want to go for any inpatient and stated that she does not need inpatient and that she is going to get help at [facility] in the next couple of days and that she just came to [facility] Emergency Room because she had a bad day. She has enough medications at home and has appointment coming up soon.  PLAN: D/C IVC and recommend discharge back home with family and will keep up her followup appointment and is aware of the same.     ____________________________ Wallace Cullens. Franchot Mimes, MD skc:lt D: 07/28/2013 56:25:63 ET T: 07/29/2013 00:53:23  ET JOB#: [id number]  cc: Arlyn Leak K. Franchot Mimes, MD, <Dictator> Dewain Penning MD ELECTRONICALLY SIGNED 07/29/2013 17:02

## 2014-06-28 NOTE — H&P (Signed)
PATIENT NAME:  Erin Lewis, BOUTIN MR#:  [id number] DATE OF BIRTH:  Dec 06, 1955  DATE OF ADMISSION:  01/21/2014  DATE OF ASSESSMENT:  01/22/2014   REFERRING PHYSICIAN: Emergency Room MD   ATTENDING PHYSICIAN: Avion Patella B. Bary Leriche, MD   IDENTIFYING DATA: Ms. Mcfarland is a 59 year old female with a history of depression.   CHIEF COMPLAINT: "I lost it."   HISTORY OF PRESENT ILLNESS: Ms. Mondry does not have any history of mental illness. However, several months ago, when she was no longer able to work due to shoulder replacement surgery, she started becoming increasingly depressed. Four months ago, she started drinking. This was something that has never happened to her in 39 years of her life. In the meantime, she did see a doctor, who started her on Celexa and clonazepam. She did not feel that medication was working, but she blames the fact that she was using alcohol on it, and since in the hospital, she feels completely different. As a result, her husband left her 2 months ago. He now is looking for his own place and will not be coming back. There are financial problems. There is only $17,000 left on the mortgage, and the patient fears that with her new disability, she will not be able to pay the pills. The husband has been somewhat helpful, but she took dissipation of marriage very, very hard. She now lives in her house with her son, his girlfriend, and 2 children, ages 44 and 51, and a dog. It is a bittersweet deal. She has a difficult time adjusting to having so many people in the house and the dog. She reports many symptoms of depression with poor sleep, decreased appetite, anhedonia, feeling of guilt, hopelessness, worthlessness, crying spells, anhedonia, social isolation. On the day of admission, she saw a blade in the house and had intrusive thoughts of using it, so she came to the hospital for help. She  called [facility], and they took her to the Emergency Room. The patient denies psychotic symptoms. She denies  symptoms suggestive of bipolar mania. She denies other than alcohol, substance use.   PAST PSYCHIATRIC HISTORY: No hospitalizations. No suicide attempts, no substance abuse history.   FAMILY PSYCHIATRIC HISTORY: None reported.   PAST MEDICAL HISTORY: Hypertension.   ALLERGIES: No known drug allergies.   MEDICATIONS ON ADMISSION: Hydrochlorothiazide 25 mg daily, Celexa 20 mg daily, Klonopin 0.5 mg 3 times daily, amlodipine 10 mg daily.   SOCIAL HISTORY: She used to work as a Social research officer, government. This led to a rotator cuff injury. She had 2 surgeries prior to replacement. With the new shoulder, there is serious limitation of her movement. She still would like to work in some capacity. She feels stuck at home, does not like to be so idle. She is on disability now. She has Medicare. Her husband also kept her on his insurance, [employer].  REVIEW OF SYSTEMS:  CONSTITUTIONAL: No fevers or chills. No weight changes.  EYES: No double or blurred vision.  ENT: No hearing loss.  RESPIRATORY: No shortness of breath or cough.  CARDIOVASCULAR: No chest pain or orthopnea.  GASTROINTESTINAL: No abdominal pain, nausea, vomiting or diarrhea.  GENITOURINARY: No incontinence or frequency.  ENDOCRINE: No heat or cold intolerance.  LYMPHATIC: No anemia or easy bruising.  INTEGUMENTARY: No acne or rash.  MUSCULOSKELETAL: No muscle or joint pain.  NEUROLOGIC: No tingling or weakness.  PSYCHIATRIC: See history of present illness for details.   PHYSICAL EXAMINATION:  VITAL SIGNS: Blood pressure 135/84, pulse  86, respirations 20, temperature 98.2.  GENERAL: This is a slender, well-developed, middle-aged female in no acute distress.  HEENT: The pupils are equal, round, and reactive to light. Sclerae are anicteric.  NECK: Supple. No thyromegaly.  LUNGS: Clear to auscultation. No dullness to percussion.  HEART: Regular rhythm and rate. No murmurs, rubs or gallops.  ABDOMEN: Soft, nontender, nondistended.  Positive bowel sounds.  MUSCULOSKELETAL: Normal muscle strength in all extremities.   SKIN: No rashes or bruises.  LYMPHATIC: No cervical adenopathy.  NEUROLOGIC: Cranial nerves II through XII are intact.   LABORATORY DATA: Chemistries are within normal limits. Blood alcohol level is 18. LFTs within normal limits. Urine tox screen positive for cannabinoids. CBC within normal limits. Urinalysis is suggestive of urinary tract infection. Serum acetaminophen and salicylate are low.   MENTAL STATUS EXAMINATION ON ADMISSION: The patient is alert and oriented to person, place, time and situation. She is pleasant, polite and cooperative. She is well-groomed and casually dressed. She maintains good eye contact. Her speech is of normal rhythm, rate and volume. Mood is depressed with flat affect. Thought process is logical and goal-oriented. Thought content: She denies thoughts of hurting herself or others, but was suicidal with a plan to cut herself on admission. There are no delusions or paranoia. There are no auditory or visual hallucinations. Her cognition is grossly intact. Registration, recall, short and long-term memory are intact. She is of average intelligence and fund of knowledge. Her insight and judgment are fair.   SUICIDE RISK ASSESSMENT ON ADMISSION: This is a patient with new onset depression and some drinking in the context of severe social stressors, who was admitted for suicidal ideation.   INITIAL DIAGNOSES:  AXIS I:  1.  Major depressive episode, severe.  2.  Alcohol use disorder, severe.  3.  Cannabis use disorder, moderate.  AXIS II: Deferred.  AXIS III:  1.  Hypertension. 2.  Urinary tract infection.   PLAN: The patient was admitted to [facility] for safety, stabilization and medication management. She was initially placed on suicide precautions and was closely monitored for any unsafe behaviors. She underwent full psychiatric and  risk assessment. She received pharmacotherapy, individual and group psychotherapy, substance abuse counseling, and support from therapeutic milieu.   1.  Suicidal ideation. She is able to contract for safety.  2.  Mood. She is not interested in changing her medications, wants to continue on Celexa and clonazepam for depression and anxiety. She has every intention to stop drinking.  3.  Substance abuse. She already spoke with Sherrian Divers from [facility]. She will follow up with them for substance abuse in IOP program.  4.  Medical: We will continue amlodipine and hydrochlorothiazide for elevated blood pressure.   DISPOSITION: She will be discharged to home. Follow up with [facility].    ____________________________ Wardell Honour Bary Leriche, MD jbp:je D: 01/22/2014 11:51:00 ET T: 01/22/2014 12:14:35 ET JOB#: [id number]  cc: Litzy Dicker B. Bary Leriche, MD, <Dictator> Clovis Fredrickson MD ELECTRONICALLY SIGNED 02/06/2014 17:20

## 2014-06-28 NOTE — Discharge Summary (Signed)
PATIENT NAME:  Erin Lewis, Erin Lewis MR#:  [id number] DATE OF BIRTH:  05-09-1955  DATE OF ADMISSION:  09/04/2013 DATE OF DISCHARGE:  09/04/2013  DISCHARGE DIAGNOSES:  1. Left-sided weakness and numbness, now resolved.  2. No transient ischemic attack or cerebrovascular accident. 3. Possible cervical radiculopathy.   SECONDARY DIAGNOSES:  1. Hypertension.  2. Hyperlipidemia.   CONSULTATIONS: None.   PROCEDURES AND RADIOLOGY: Bilateral carotid Doppler on July 1st showed mild atherosclerotic disease. No significant stenosis.  MRI of the brain without contrast was negative for any acute pathology. A CT scan of the head without contrast on June 30th showed no acute intracranial pathology.   MAJOR LABORATORY PANEL: UA on admission was negative. Serum Tylenol level less than 2. Salicylate level was elevated with a value of 5.8.   HISTORY AND SHORT HOSPITAL COURSE: The patient is a 59 year old female with no significant medical problems who was admitted for left-sided facial numbness and weakness, thought to have possible transient ischemic attack although this was ruled out with extensive neurological work-up. Please see Dr. Emeline Gins Elgergawy's dictated history and physical for further details. After negative neurological work-up, this was thought to be possible cervical radiculopathy and was discharged home and her symptoms had almost resolved. On the date of discharge, her vital signs are as follows: Temperature 98, heart rate 88 per minute, respirations 16 per minute, blood pressure 130/75 mmHg, and she was saturating 96% on room air.    PERTINENT PHYSICAL EXAMINATION ON THE DATE OF DISCHARGE: CARDIOVASCULAR: S1, S2 normal. No murmurs, rubs, or gallop.  LUNGS: Clear to auscultation bilaterally. No wheezing, rales, rhonchi, crepitation.   ABDOMEN: Soft, benign.  NEUROLOGIC: Nonfocal examination.   All other physical examination remained at baseline.   DISCHARGE MEDICATIONS:  1. Aspirin 325 mg p.o.  daily.  2. Atorvastatin 20 mg p.o. daily.  3. HCTZ 12.5 mg p.o. daily.  4. Amlodipine 10 mg p.o. daily.   DISCHARGE DIET: Low sodium, low fat, low cholesterol.   DISCHARGE ACTIVITY: As tolerated.   DISCHARGE INSTRUCTIONS AND FOLLOWUP: The patient was instructed to follow up with her primary care physician, Dr. Terrilee Files, in 1-2 weeks. She will need followup with [facility] in 2-4 weeks.   TOTAL TIME DISCHARGING THIS PATIENT: 45 minutes.    ____________________________ Lucina Mellow. Manuella Ghazi, MD vss:dd D: 09/06/2013 20:02:13 ET T: 09/07/2013 05:40:08 ET JOB#: [id number]  cc: Bergen Magner S. Manuella Ghazi, MD, <Dictator> Smitty Pluck, MD [facility] Lucina Mellow [facility] MD ELECTRONICALLY SIGNED 09/08/2013 18:39

## 2014-06-28 NOTE — Consult Note (Signed)
PATIENT NAME:  Erin Lewis, Erin Lewis MR#:  [id number] DATE OF BIRTH:  1955-07-13  DATE OF CONSULTATION:  01/21/2014  REFERRING PHYSICIAN:   CONSULTING PHYSICIAN:  Gonzella Lex, MD  IDENTIFYING INFORMATION AND REASON FOR CONSULTATION: This is a 59 year old woman who presented to the Emergency Room with a chief complaint of, "I'm very depressed".   HISTORY OF PRESENT ILLNESS: Information obtained from the patient and the chart. The patient states that her mood has very recently been extremely depressed. She sleeps poorly at night with frequent awakenings. She has not been eating as much as usual. She has decreased interest in normal activities. Some days are worse than others, and her mornings tend to be worse than the rest of the day. She tries to stay busy, but especially for the last week, has been feeling overwhelmed. She has been prescribed Celexa and clonazepam and says that she has been taking them for the last couple months, but does not feel like they have been working. She admits that she has been drinking alcohol, drinking some wine every day. Yesterday, she reports was a "bad day". She uses marijuana only intermittently. Her major stress is that her husband left her about 2 months ago. She is not reporting acute suicidal ideation, and is not reporting acute psychotic symptoms, but is feeling completely overwhelmed.   PAST PSYCHIATRIC HISTORY: The patient has had previous hospitalization in the past. She denies any actual suicide attempts in the past. She has had some problems with misuse of prescription drugs in the past, but says that is not happening currently. She has been treated with antidepressants in the past, and is currently taking Celexa and Klonopin. She says that she has been on them for a couple of months, does not think that it particularly helpful. She seems more invested in the Klonopin than she is in the Celexa. She was here in the hospital about a year and a half ago, and at that  time was discharged on Prozac. She tells me she did not think that was helpful either.   PAST MEDICAL HISTORY: The patient has high blood pressure and takes medication for it. She has chronic arm pain. Otherwise, does not have any significant ongoing medical problems.   SOCIAL HISTORY: The patient had previously been living with her husband. They have been married for over 30 years. He left a couple of months ago. She is on disability. Does not have much to do during the day. Her adult son and his girlfriend and their 2 young children stay with the patient. She does not have any complaints about that.   FAMILY HISTORY: No known family history of mental illness.   SUBSTANCE ABUSE HISTORY: She has a past history of misuse of prescription drugs; not currently apparently doing that. Has been drinking recently; it is not clear how much that is affecting her mood state. She tends to minimize it.   CURRENT MEDICATIONS: Celexa 20 mg per day, clonazepam 0.5 mg 3 times a day, amlodipine and hydrochlorothiazide daily for blood pressure.   ALLERGIES: No known drug allergies.   REVIEW OF SYSTEMS: Depressed. Poor sleep. Poor appetite. Frequent crying spells. Feeling of hopelessness. Denies psychotic symptoms. Denies suicidal plans. Poor appetite. Fatigue. Arm pain. Otherwise, unremarkable.   MENTAL STATUS EXAMINATION: A somewhat disheveled woman, looks her stated age, cooperative with the interview. Eye contact intermittent. Psychomotor activity marked by frequent crying. She is crying throughout most of the interview, but is appropriate in participation in the interview.  Speech is normal, overall in rate, tone and volume. Affect is tearful. Mood is stated as depressed. Thoughts appear to be generally lucid. No sign of delusions or loosening of associations. Denies any acute suicidal intent. No homicidal ideation. Denies auditory or visual hallucinations. Can remember 3 out of 3 objects immediately, and 3 out of 3  at 3 minutes. Long-term short-term memory intact. Judgment and insight adequate. Intelligence and general fund of knowledge appear normal.   LABORATORY RESULTS: Salicylates are slightly elevated at 6.9. Acetaminophen negative. Alcohol level is 18. Chemistry panel is all normal. CBC unremarkable. Urinalysis with 2+ blood, trace leukocyte esterase, 7 white cells, possible infection. Drug screen is positive for cannabis.   VITAL SIGNS: Blood pressure is currently 118/68, respirations 18, pulse 65, temperature 97.9.   ASSESSMENT: This is a 59 year old woman with symptoms of a major depression. While she is not reporting suicidal ideation, she is extremely overwhelmed and not getting better with her outpatient treatment. She is crying all the time. Not functioning well. Also has been drinking a little bit, possibly more than is healthy. I think it is appropriate to admit her to the hospital for stabilization under the circumstances.   TREATMENT PLAN:  1.  Admit to psychiatry.  2.  Detox orders are not necessary.  3.  Continue outpatient medicine.  4.  Monitor for any sign of symptomatic withdrawal.  5.  Treat the urinary tract infection.  6.  Suicide precautions in place, as well as fall precautions.   DIAGNOSIS, PRINCIPAL AND PRIMARY:  AXIS I: Major depression, severe, recurrent.   SECONDARY DIAGNOSES:  AXIS I: Alcohol abuse. Opiate and benzodiazepine abuse, in remission.  AXIS II: Deferred.  AXIS III: High blood pressure.    ___________________________ Gonzella Lex, MD jtc:MT D: 01/21/2014 11:54:09 ET T: 01/21/2014 12:21:13 ET JOB#: [id number]  cc: Gonzella Lex, MD, <Dictator> Gonzella Lex MD ELECTRONICALLY SIGNED 02/14/2014 19:06

## 2014-06-28 NOTE — H&P (Signed)
PATIENT NAME:  Erin Lewis, Erin Lewis MR#:  [id number] DATE OF BIRTH:  Jan 03, 1956  DATE OF ADMISSION:  09/04/2013  REFERRING PHYSICIAN: Dr. Lurline Hare.   PRIMARY CARE PHYSICIAN: Dr. Terrilee Files.   CHIEF COMPLAINT: Left facial numbness, left side weakness; started yesterday morning.   HISTORY OF PRESENT ILLNESS: This is a 59 year old female with known history of hypertension, hyperlipidemia, depression. The patient presents to Emergency Room with chief complaints of left facial numbness, left side weakness as well. Started since morning. The patient reports her symptoms have been there since the morning. She denies any loss of consciousness, any altered mental status. On presentation to the ED, the patient was not noticed to have any facial droop, but she was still complaining of the numbness. Her physical exam was inconsistent. She had CT head without contrast which did not show any acute findings. The patient was not noticed to have any dysphagia or slurred speech. She denied any blurred vision or visual loss. Hospitalist was requested to admit the patient for further evaluation. The patient was seen in the ED last month for suicide attempt with medication overdose, where she was discharged from ED. The patient currently denies any suicidal thoughts or ideation.    PAST MEDICAL HISTORY:  1. Hypertension.  2. Hyperlipidemia.   SOCIAL HISTORY: The patient smokes 1/2 pack per day. She reports she drinks 1 glass of wine per day at night. No illicit drugs.   FAMILY HISTORY: Denies any hypertension, diabetes or coronary artery disease in the family.   PAST SURGICAL HISTORY:  1. C-section.  2. Rotator cuff surgery on the right shoulder.   ALLERGIES: None.   HOME MEDICATIONS:  1. Hydrochlorothiazide 12.5 mg oral daily.  2. Atorvastatin 20 mg daily.  3. Norvasc 5 mg daily.  4. Aspirin 325 mg oral daily.   REVIEW OF SYSTEMS:  CONSTITUTIONAL: Denies fever, chills, fatigue, weakness, weight gain, weight  loss.  EYES: Denies blurry vision, double vision, inflammation.  EARS, NOSE, THROAT: Denies tinnitus, ear pain, hearing loss, epistaxis.  RESPIRATORY: Denies cough, wheezing, hemoptysis.  CARDIOVASCULAR: Denies chest pain, edema, palpitation, syncope.  GASTROINTESTINAL: Denies nausea, vomiting, diarrhea. Denies hematemesis.  GENITOURINARY: Denies dysuria, hematuria, renal colic.  ENDOCRINE: Denies polyuria, polydipsia, heat or cold intolerance.  HEMATOLOGY: Denies anemia, easy bruising, bleeding diathesis.  INTEGUMENT: Denies acne, rash or skin lesion.  MUSCULOSKELETAL: Denies any swelling, gout, arthritis or cramps.  NEUROLOGIC: Reports left side weakness, tingling, numbness. Denies any memory loss or loss of consciousness.  PSYCHIATRIC: Denies anxiety, insomnia. Has history of depression in the past.   PHYSICAL EXAMINATION:  VITAL SIGNS: Temperature 98.7, pulse 82, respiratory rate 17, blood pressure 163/90, saturating 86% on room air.  GENERAL: Well-nourished female, looks comfortable in bed, in no apparent distress.  HEENT: Head atraumatic, normocephalic. Pupils equal, reactive to light. Pink conjunctivae. Anicteric sclerae. Moist oral mucosa.  NECK: Supple. No thyromegaly. No JVD.  CHEST: Good air entry bilaterally. No wheezing, rales or rhonchi.  CARDIOVASCULAR: S1, S2 heard. No rubs, murmurs, gallops.  ABDOMEN: Soft, nontender, nondistended. Bowel sounds present.  EXTREMITIES: No edema. No clubbing. No cyanosis. Pedal and radial pulses +2 bilaterally.  PSYCHIATRIC: Appropriate affect. Awake, alert x 3. Intact judgment and insight.  NEUROLOGIC: Cranial nerves grossly intact. No facial droop. Tongue is midline. Right upper and lower extremities 5 out of 5. Left side is inconsistent as at one point the patient appears to be 3 to 4 out of 5 but at some points, the patient does exert full  effort without any apparent weakness. She does have dysmetria on the left side. Sensation is  symmetrical and intact. Deep tendon reflexes +2.   PERTINENT LABORATORIES: Glucose 94, BUN 16, creatinine 0.69, sodium 138, potassium 3.3, chloride 105, CO2 24. ALT 16, ALT 19, alk phos 72. Troponin less than 0.02. TSH 2.6. Urine drug screen is negative. White blood cells 7.7, hemoglobin 14.9, hematocrit 43.4, platelets 287. INR 0.9. Urinalysis negative for leukocyte esterase and nitrite.   IMAGING: CT head without contrast is negative.   ASSESSMENT AND PLAN:  1. Left side weakness and numbness: Physical exam is inconsistent. It is unclear if this is related to cerebrovascular accident or not, so the patient will be admitted for further workup. Will admit her to telemetry unit. Monitor her rhythm. Will check MRI of the brain. As well, will check carotid Dopplers. Will continue her on aspirin as well. Will continue on deep vein thrombosis prophylaxis.  2. Hypertension: Continue the patient on home medication.  3. Tobacco abuse: The patient was counseled at length. The patient reports she we will try to quit smoking. At this point, she does not want nicotine patches.  4. Hyperlipidemia: Will continue with statin.  5. Deep vein thrombosis prophylaxis: Subcutaneous heparin.   CODE STATUS: FULL CODE.   TOTAL TIME SPENT ON ADMISSION AND PATIENT CARE: 55 minutes.    ____________________________ Albertine Patricia, MD dse:gb D: 09/04/2013 01:48:36 ET T: 09/04/2013 02:02:29 ET JOB#: [id number]  cc: Albertine Patricia, MD, <Dictator> Thessaly Mccullers Graciela Husbands MD ELECTRONICALLY SIGNED 09/13/2013 0:49

## 2014-07-06 NOTE — Consult Note (Signed)
PATIENT NAME:  Erin Lewis, Erin Lewis MR#:  [id number] DATE OF BIRTH:  02-08-1956  DATE OF CONSULTATION:  03/21/2014  CONSULTING PHYSICIAN:  Gonzella Lex, MD  IDENTIFYING INFORMATION AND REASON FOR CONSULT:  A 59 year old woman who presents voluntarily to the Emergency Room accompanied by her son with a chief complaint "I'm doing very badly."   HISTORY OF PRESENT ILLNESS: Information from the patient and the chart. I had met this patient previously a couple of months ago and remember that interactions well. The patient reports that her mood continues to be extremely depressed. She spends virtually all of her time shut up in the room that she is inhabiting at her son's house. Almost no social activity. Sleep is erratic. Appetite poor. Cries most of the time. Feels extremely negative and hopeless. Continues to have suicidal thoughts with thoughts of overdose but has not acted on it. Does not report any psychotic symptoms. She tearfully admits that she is continuing to drink heavily. Consuming probably the equivalent of a 12 pack a day at times. She has been compliant with her prescribed medication.   PAST PSYCHIATRIC HISTORY: The patient presented to the Emergency Room in November with the same symptoms. Major stress with her husband had left her couple of months previously. She had become severely depressed with some suicidal ideation. New onset alcohol abuse problem. She was admitted to psychiatry and stabilized, detoxed and treated. Prior to that has not had psychiatric admissions. No prior suicide attempts. She is currently being treated with a combination of citalopram and clonazepam for her psychiatric condition.   SOCIAL HISTORY: The patient's husband left after a long marriage just a few months ago. The patient is now living with her adult son and his family. She is not working and is very isolated. Continues to be bitter and angry about the situation.   MEDICAL HISTORY: High blood pressure. No history  of heart attack or stroke.   SUBSTANCE ABUSE HISTORY: Relatively recent onset of alcohol abuse. No history of seizures or delirium tremens. Is not currently engaging very actively in substance abuse treatment. Admits that she continues to use marijuana occasionally as well. No other drugs.   FAMILY HISTORY: Denies family history.   CURRENT MEDICATIONS: Amlodipine 10 mg per day, citalopram 40 mg per day, clonazepam 0.5 mg 3 times a day, hydrochlorothiazide 12.5 mg once a day, hydroxyzine 50 mg at night for sleep.   ALLERGIES:  No known drug allergies.   REVIEW OF SYSTEMS: Depressed mood. Hopelessness. Low energy. Passive suicidal thoughts. Nervousness, poor sleep, nausea.   MENTAL STATUS EXAMINATION: Disheveled woman, very tearful, obviously upset, interviewed in the Emergency Room, cooperative with the interview and forthcoming. Eye contact is good. Psychomotor activity very sluggish. Speech is decreased in rate but understandable. Affect is tearful throughout the interview. Mood stated as terrible. Thoughts are generally lucid. No evidence of delusional thinking or bizarre thinking. Denies auditory hallucinations. Denies any homicidal ideation. Does have positive suicidal ideation. Alert and oriented x 4. Repeats 3 objects immediately and remembers 2 out of 3 at 3 minutes. Judgment and insight adequate to coming in the hospital. Intelligence normal.   LABORATORY RESULTS: Urinalysis. No sign of infection. Drug screen positive for cannabis. Salicylates slightly elevated at 6.2 but not toxic. Alcohol level 192. Chemistry panel: Low sodium 135. CBC: Unremarkable.   VITAL SIGNS: In the Emergency Room blood pressure currently 107/55, respirations 16, pulse 75, temperature 98.2.   ASSESSMENT: A 59 year old woman with major depression, severe, and alcohol  abuse returns to the hospital extremely depressed, continuing to drink. Some suicidal ideation. Quite distraught. Needs hospitalization for detox and  stabilization and reassessment of treatment plan.   TREATMENT PLAN: Admit to psychiatry. Suicide precautions as well as seizure and fall precautions in place. Full detox orders done. Continue outpatient psychiatric medicines for now. Supportive therapy and review of treatment plan with the patient who is agreeable.   DIAGNOSIS, PRINCIPAL AND PRIMARY:  AXIS I: Major depression, severe, single episode.   SECONDARY DIAGNOSES:  AXIS I: Alcohol abuse, severe.  AXIS II: Deferred.  AXIS III: High blood pressure.    ____________________________ Gonzella Lex, MD jtc:AT D: 03/21/2014 23:30:18 ET T: 03/22/2014 00:23:32 ET JOB#: [id number]  cc: Gonzella Lex, MD, <Dictator> Gonzella Lex MD ELECTRONICALLY SIGNED 04/16/2014 17:19

## 2014-07-06 NOTE — H&P (Signed)
PATIENT NAME:  Erin Lewis, Erin Lewis MR#:  [id number] DATE OF BIRTH:  22-Feb-1956  DATE OF ADMISSION:  03/22/2014  IDENTIFYING DATA: The patient is a 59 year old, recently separated, female with 2 prior admissions to this facility in [place].  CHIEF COMPLAINT:  "My life is falling apart."  HISTORY OF PRESENT ILLNESS:  The patient presented to the Emergency Room on 03/21/2014.  She was accompanied by her son.  The patient endorses symptoms of depression including poor sleep, anhedonia, poor appetite and depressed mood.  She has also been crying most of the time and isolating herself.  She did report, in the Emergency Room, that she had some suicidal thoughts with thoughts about overdosing, but had not acted on it.  Today, she indicates she does not have any intent or plan to hurt herself, but simply feels sad and does not know what to do.    The patient has been consuming alcohol since her husband left her in September 2015.  She states that the amount depends on the type of alcohol.  She states if it is beer, it is typically a 12-pack a day.  She states if it is liquor it is a pint a day.  And, she states that if it is wine, it might be a bottle.    The patient has been receiving from her primary care physician.  Currently, her treatment consists of Celexa as well as Klonopin.  She initially stated these medications have not helped her.  However, when we discussed options, she indicated that the Celexa may have been helping her a little bit in the morning, but by noon she returns to crying.  PAST PSYCHIATRIC HISTORY:  The patient states that she was previously admitted to this facility in 2014 and 2013 for depression and crying.   She denies any past suicide attempts.  She denies any outpatient treatment with mental health professionals.  PAST MEDICAL HISTORY:  The patient has hypertension, 2 C-sections, 3 operations on her right shoulder 1 for a rotator cuff repair and another for a shoulder  replacement.  FAMILY HISTORY:  She denies any family history of psychiatric illness.  SOCIAL HISTORY:  As discussed above, her husband left her in September 2015 after 31 years of marriage.  She has 1 son age 58 and another son age 77.  She states she has 7 grandchildren and is expecting another grandchild.    REVIEW OF SYSTEMS:  The patient states that she sweats at night, but this has been since menopause. CARDIOVASCULAR:  She denies any chest pain. PULMONARY:  She denies any shortness of breath. GASTROINTESTINAL:  She denies any nausea, vomiting. MUSCULOSKELETAL:  She denies any joint pain or physical aches or pains. She denies any other physical problems.  All other review of systems is negative.  MENTAL STATUS EXAMINATION:   The patient is a middle-aged female appearing her stated age of 35.  She is alert and oriented x 4.  There are no abnormal movements.  She made intermittent eye contact.  She did cry periodically throughout the interview. Her speech was normal rate and volume.  Her thought process was linear and goal directed.  Her thought content, there was no auditory hallucinations, no visual hallucinations, no delusional thinking.  She denied any suicidal ideation at this time.  Her mood she stated was bad.  Her affect was tearful and depressed.  Her insight and judgment are fair.  Her remote memory was intact, as per ability to provide past history,  dates.  Her immediate memory was intact as per ability to describe recent events such as in the Emergency Room here.  Her fund of knowledge was average.    PHYSICAL EXAMINATION:   VITAL SIGNS:  Temperature 98.4, blood pressure 161/92, pulse 64. MUSCULOSKELETAL:  She ambulated without problem.  Her gait was normal.  LABORATORY RESULTS:  Urine toxicology was positive for marijuana.  CBC was within normal limits with the exception of a mildly elevated RDW at 15.7.  Urinalysis was within normal limits.  Her acetaminophen level was less than  2.  Her salicylate level was 6.2.  DIAGNOSES:  Alcohol use disorder, severe, major depressive disorder, severe without psychotic features.  ASSESSMENT AND PLAN:  We discussed medication options.  Initially, we were discussing augmenting her current regimen with Wellbutrin, however the patient reports that that she has had some seizures in the past, although it has been some time since she has had any. Thus, we will go ahead and replace her Celexa with Effexor.  We will also start Remeron in the evening to address her neurovegetative symptoms of poor sleep as well as poor appetite. She will continue on alcohol withdrawal CIWA protocol.          ____________________________ Marvis Repress. Jimmye Norman, MD alw:DT D: 03/22/2014 09:42:27 ET T: 03/22/2014 10:03:23 ET JOB#: [id number]  cc: Keyira Mondesir L. Jimmye Norman, MD, <Dictator> Marjie Skiff MD ELECTRONICALLY SIGNED 03/24/2014 17:25

## 2014-09-18 ENCOUNTER — Ambulatory Visit: Payer: PRIVATE HEALTH INSURANCE | Admitting: Psychiatry

## 2014-10-06 ENCOUNTER — Ambulatory Visit (INDEPENDENT_AMBULATORY_CARE_PROVIDER_SITE_OTHER): Payer: PRIVATE HEALTH INSURANCE | Admitting: Licensed Clinical Social Worker

## 2014-10-06 ENCOUNTER — Other Ambulatory Visit: Payer: Self-pay

## 2014-10-06 DIAGNOSIS — F1010 Alcohol abuse, uncomplicated: Secondary | ICD-10-CM | POA: Diagnosis not present

## 2014-10-06 DIAGNOSIS — F1011 Alcohol abuse, in remission: Secondary | ICD-10-CM

## 2014-10-06 DIAGNOSIS — F411 Generalized anxiety disorder: Secondary | ICD-10-CM

## 2014-10-06 DIAGNOSIS — F332 Major depressive disorder, recurrent severe without psychotic features: Secondary | ICD-10-CM

## 2014-10-06 DIAGNOSIS — F419 Anxiety disorder, unspecified: Secondary | ICD-10-CM | POA: Diagnosis not present
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
## 2014-10-07 DIAGNOSIS — F411 Generalized anxiety disorder: Secondary | ICD-10-CM | POA: Insufficient documentation

## 2014-10-07 DIAGNOSIS — F332 Major depressive disorder, recurrent severe without psychotic features: Secondary | ICD-10-CM | POA: Insufficient documentation

## 2015-03-23 ENCOUNTER — Emergency Department
Admission: EM | Admit: 2015-03-23 | Discharge: 2015-03-23 | Disposition: A | Discharge status: Home / Self Care | Payer: Medicare Other | Attending: Emergency Medicine | Admitting: Emergency Medicine

## 2015-03-23 ENCOUNTER — Encounter: Payer: Self-pay | Admitting: *Deleted

## 2015-03-23 DIAGNOSIS — F17210 Nicotine dependence, cigarettes, uncomplicated: Secondary | ICD-10-CM | POA: Insufficient documentation

## 2015-03-23 DIAGNOSIS — B372 Candidiasis of skin and nail: Secondary | ICD-10-CM | POA: Insufficient documentation

## 2015-03-23 DIAGNOSIS — R21 Rash and other nonspecific skin eruption: Secondary | ICD-10-CM | POA: Diagnosis present

## 2015-03-23 DIAGNOSIS — Z79899 Other long term (current) drug therapy: Secondary | ICD-10-CM | POA: Insufficient documentation

## 2015-03-23 DIAGNOSIS — L309 Dermatitis, unspecified: Secondary | ICD-10-CM | POA: Insufficient documentation

## 2015-03-23 DIAGNOSIS — I10 Essential (primary) hypertension: Secondary | ICD-10-CM | POA: Insufficient documentation

## 2015-03-23 HISTORY — DX: Essential (primary) hypertension: I10

## 2015-03-23 MED ORDER — RANITIDINE HCL 150 MG PO TABS: 150.0000 mg | ORAL_TABLET | Freq: Two times a day (BID) | ORAL | Status: DC

## 2015-03-23 MED ORDER — CYPROHEPTADINE HCL 4 MG PO TABS: 4.0000 mg | ORAL_TABLET | Freq: Three times a day (TID) | ORAL | Status: DC | PRN

## 2015-03-23 MED ORDER — KETOCONAZOLE 2 % EX CREA: 1.0000 "application " | TOPICAL_CREAM | Freq: Two times a day (BID) | CUTANEOUS | Status: DC

## 2015-03-23 NOTE — ED Provider Notes (Signed)
[facility] Emergency Department Provider Note ____________________________________________  Time seen: 1612  I have reviewed the triage vital signs and the nursing notes.  HISTORY  Chief Complaint  Rash  HPI Erin Lewis is a 60 y.o. female presents to the ED for evaluation of an itchy rash that she noted today to her neck, chest and bikini area over the last 2 weeks. She describes itchy rash started in her bikini area on her mons pubis about 2 weeks prior. Since that time she's noted no significant relief with taking Benadryl. She also notes that the rash seems to have worsened with application of topical cortisone cream. In that time she's also noted some spreading of itchiness to the area underneath her breasts bilaterally. She also reports some small red bumps to the right posteriolateral sideof her neck. She denies any difficulty breathing, swallowing, or coughing. She also denies any swelling to the lips, throat, or tongue. She reports that the increased itching is what prompted her to seek emergency care. She was able to contact a dermatologist, but will not be able to have an appointment until Wednesday.  Past Medical History  Diagnosis Date  . Hypertension     Patient Active Problem List   Diagnosis Date Noted  . Major depressive disorder, recurrent, severe without psychotic features ([place]) 10/07/2014  . Anxiety reaction 10/07/2014    History reviewed. No pertinent past surgical history.  Current Outpatient Rx  Name  Route  Sig  Dispense  Refill  . cyproheptadine (PERIACTIN) 4 MG tablet   Oral   Take 1 tablet (4 mg total) by mouth 3 (three) times daily as needed for allergies.   30 tablet   0   . hydrochlorothiazide (MICROZIDE) 12.5 MG capsule   Oral   Take 12.5 mg by mouth daily.      0   . ketoconazole (NIZORAL) 2 % cream   Topical   Apply 1 application topically 2 (two) times daily.   30 g   0   . ranitidine (ZANTAC) 150 MG  tablet   Oral   Take 1 tablet (150 mg total) by mouth 2 (two) times daily.   20 tablet   0   . venlafaxine XR (EFFEXOR-XR) 75 MG 24 hr capsule   Oral   Take 75 mg by mouth daily.      2   . zaleplon (SONATA) 10 MG capsule   Oral   Take 10 mg by mouth at bedtime as needed.      0    Allergies Review of patient's allergies indicates no known allergies.  No family history on file.  Social History Social History  Substance Use Topics  . Smoking status: Current Every Day Smoker -- 0.50 packs/day    Types: Cigarettes  . Smokeless tobacco: None  . Alcohol Use: No   Review of Systems  Constitutional: Negative for fever. Eyes: Negative for visual changes. ENT: Negative for sore throat. Cardiovascular: Negative for chest pain. Respiratory: Negative for shortness of breath. Gastrointestinal: Negative for abdominal pain, vomiting and diarrhea. Genitourinary: Negative for dysuria. Musculoskeletal: Negative for back pain. Skin:  Positive for rash. Neurological: Negative for headaches, focal weakness or numbness. ____________________________________________  PHYSICAL EXAM:  VITAL SIGNS: ED Triage Vitals  Enc Vitals Group     BP 03/23/15 1540 173/90 mmHg     Pulse Rate 03/23/15 1540 93     Resp 03/23/15 1540 20     Temp 03/23/15 1540 97.8 F (36.6 C)  Temp Source 03/23/15 1540 Oral     SpO2 03/23/15 1540 99 %     Weight --      Height 03/23/15 1540 5\' 4"  (1.626 m)     Head Cir --      Peak Flow --      Pain Score --      Pain Loc --      Pain Edu? --      Excl. in [place]? --    Constitutional: Alert and oriented. Well appearing and in no distress. Head: Normocephalic and atraumatic.      Eyes: Conjunctivae are normal. PERRL. Normal extraocular movements      Ears: Canals clear. TMs intact bilaterally.   Nose: No congestion/rhinorrhea.   Mouth/Throat: Mucous membranes are moist.   Neck: Supple. No thyromegaly. Hematological/Lymphatic/Immunological: No  cervical lymphadenopathy. Cardiovascular: Normal rate, regular rhythm.  Respiratory: Normal respiratory effort. No wheezes/rales/rhonchi. Gastrointestinal: Soft and nontender. No distention. Musculoskeletal: Nontender with normal range of motion in all extremities.  Neurologic:  Normal gait without ataxia. Normal speech and language. No gross focal neurologic deficits are appreciated. Skin:  Skin is warm, dry and intact.  Patient is noted to have several scattered erythematous papules to the posterior aspect of the right neck. Along the upper chest and under the breast she said it just has generalized erythema and edema, without any excoriations, papules, or scaling. In the area of the bikini, she is noted to have two distinct hypertropic lesions with scaling and excoriations. The have well-defined borders and are slightly erythematous.   Psychiatric: Mood and affect are normal. Patient exhibits appropriate insight and judgment. ____________________________________________  INITIAL IMPRESSION / ASSESSMENT AND PLAN / ED COURSE  Patient with dermatitis to the bikini area and to the intertriginous area under the breast consistent with likely yeast dermatitis. She'll be discharged with antihistamine E medications and the form of Periactin and ranitidine. She is also provided with a prescription for acute, resolved to apply to the chest and bikini area as needed. She will keep her appointment as scheduled, and follow-up with her dermatologist on Wednesday. ____________________________________________  FINAL CLINICAL IMPRESSION(S) / ED DIAGNOSES  Final diagnoses:  Yeast dermatitis      Melvenia Needles, PA-C 03/23/15 1654  Nance Pear, MD 03/23/15 2018

## 2015-03-23 NOTE — ED Notes (Signed)
Pt reports a red rash on neck, chest and peri area, pt denies any problems

## 2015-03-23 NOTE — Discharge Instructions (Signed)
Cutaneous Candidiasis Cutaneous candidiasis is a condition in which there is an overgrowth of yeast (candida) on the skin. Yeast normally live on the skin, but in small enough numbers not to cause any symptoms. In certain cases, increased growth of the yeast may cause an actual yeast infection. This kind of infection usually occurs in areas of the skin that are constantly warm and moist, such as the armpits or the groin. Yeast is the most common cause of diaper rash in babies and in people who cannot control their bowel movements (incontinence). CAUSES  The fungus that most often causes cutaneous candidiasis is Candida albicans. Conditions that can increase the risk of getting a yeast infection of the skin include:  Obesity.  Pregnancy.  Diabetes.  Taking antibiotic medicine.  Taking birth control pills.  Taking steroid medicines.  Thyroid disease.  An iron or zinc deficiency.  Problems with the immune system. SYMPTOMS   Red, swollen area of the skin.  Bumps on the skin.  Itchiness. DIAGNOSIS  The diagnosis of cutaneous candidiasis is usually based on its appearance. Light scrapings of the skin may also be taken and viewed under a microscope to identify the presence of yeast. TREATMENT  Antifungal creams may be applied to the infected skin. In severe cases, oral medicines may be needed.  HOME CARE INSTRUCTIONS   Keep your skin clean and dry.  Maintain a healthy weight.  If you have diabetes, keep your blood sugar under control. SEEK IMMEDIATE MEDICAL CARE IF:  Your rash continues to spread despite treatment.  You have a fever, chills, or abdominal pain.   This information is not intended to replace advice given to you by your health care provider. Make sure you discuss any questions you have with your health care provider.   Document Released: 11/09/2010 Document Revised: 05/16/2011 Document Reviewed: 08/25/2014 Elsevier Interactive Patient Education 2016 [place] the skin clean and dry. Apply the antifungal daily. Take the antihistamine medicines as directed. Follow-up with your dermatologist as planned for further evaluation.

## 2015-09-11 ENCOUNTER — Encounter: Payer: Self-pay | Admitting: *Deleted

## 2015-09-11 ENCOUNTER — Emergency Department
Admission: EM | Admit: 2015-09-11 | Discharge: 2015-09-11 | Disposition: A | Discharge status: Home / Self Care | Payer: Medicare Other | Attending: Emergency Medicine | Admitting: Emergency Medicine

## 2015-09-11 DIAGNOSIS — F332 Major depressive disorder, recurrent severe without psychotic features: Secondary | ICD-10-CM | POA: Diagnosis not present

## 2015-09-11 DIAGNOSIS — I10 Essential (primary) hypertension: Secondary | ICD-10-CM | POA: Diagnosis not present

## 2015-09-11 DIAGNOSIS — R55 Syncope and collapse: Secondary | ICD-10-CM | POA: Diagnosis present

## 2015-09-11 DIAGNOSIS — F17210 Nicotine dependence, cigarettes, uncomplicated: Secondary | ICD-10-CM | POA: Insufficient documentation

## 2015-09-11 DIAGNOSIS — Z7982 Long term (current) use of aspirin: Secondary | ICD-10-CM | POA: Insufficient documentation

## 2015-09-11 DIAGNOSIS — Z79899 Other long term (current) drug therapy: Secondary | ICD-10-CM | POA: Insufficient documentation

## 2015-09-11 LAB — CBC
HEMATOCRIT: 46.7 % (ref 35.0–47.0)
HEMOGLOBIN: 16.3 g/dL — AB (ref 12.0–16.0)
MCH: 32.2 pg (ref 26.0–34.0)
MCHC: 34.9 g/dL (ref 32.0–36.0)
MCV: 92.1 fL (ref 80.0–100.0)
Platelets: 257 10*3/uL (ref 150–440)
RBC: 5.07 MIL/uL (ref 3.80–5.20)
RDW: 14.8 % — ABNORMAL HIGH (ref 11.5–14.5)
WBC: 9.4 10*3/uL (ref 3.6–11.0)

## 2015-09-11 LAB — URINALYSIS COMPLETE WITH MICROSCOPIC (ARMC ONLY)
BACTERIA UA: NONE SEEN
Bilirubin Urine: NEGATIVE
Glucose, UA: NEGATIVE mg/dL
Ketones, ur: NEGATIVE mg/dL
NITRITE: NEGATIVE
PH: 5 (ref 5.0–8.0)
PROTEIN: NEGATIVE mg/dL
SPECIFIC GRAVITY, URINE: 1.024 (ref 1.005–1.030)

## 2015-09-11 LAB — BASIC METABOLIC PANEL
ANION GAP: 9 (ref 5–15)
BUN: 21 mg/dL — AB (ref 6–20)
CHLORIDE: 105 mmol/L (ref 101–111)
CO2: 22 mmol/L (ref 22–32)
Calcium: 9.8 mg/dL (ref 8.9–10.3)
Creatinine, Ser: 0.98 mg/dL (ref 0.44–1.00)
GFR calc Af Amer: >60 mL/min (ref >60)
GLUCOSE: 103 mg/dL — AB (ref 65–99)
POTASSIUM: 4 mmol/L (ref 3.5–5.1)
SODIUM: 136 mmol/L (ref 135–145)

## 2015-09-11 MED ORDER — SODIUM CHLORIDE 0.9 % IV BOLUS (SEPSIS)
2000.0000 mL | Freq: Once | INTRAVENOUS | Status: AC
  Administered 2015-09-11: 2000 mL via INTRAVENOUS

## 2015-09-11 MED ORDER — HYDROCHLOROTHIAZIDE 12.5 MG PO TABS: 12.5000 mg | ORAL_TABLET | Freq: Every day | ORAL | Status: DC

## 2015-09-11 MED ORDER — HYDROCHLOROTHIAZIDE 12.5 MG PO CAPS
12.5000 mg | ORAL_CAPSULE | Freq: Every day | ORAL | Status: DC
  Administered 2015-09-11: 12.5 mg via ORAL
  Filled 2015-09-11: qty 1

## 2015-09-11 NOTE — Discharge Instructions (Signed)
You have been seen today in the Emergency Department (ED)  for syncope (passing out).  Your workup including labs and EKG did not show a cause for your syncope and were overall reassuring.    Your symptoms may be due to dehydration, so it is important that you drink plenty of non-alcoholic fluids. Emotional stress, pain, or overheating--especially if you have been standing--can make you faint. In these cases, fainting is usually not serious. But fainting can be a sign of a more serious problem. Therefore it is imperative that you follow up with your doctor in 1-2 days for further evaluation.  Start taking 12.5mg  daily hydrochlorothiazide again for hypertension.  When should you call for help?  Call 911 anytime you think you may need emergency care. For example, call if:  You have symptoms of a heart problem. These may include:  Chest pain or pressure.  Severe trouble breathing.  A fast or irregular heartbeat.  Lightheadedness or sudden weakness.  Coughing up pink, foamy mucus.  Passing out. You have symptoms of a stroke. These may include:  Sudden numbness, tingling, weakness, or loss of movement in your face, arm, or leg, especially on only one side of your body.  Sudden vision changes.  Sudden trouble speaking.  Sudden confusion or trouble understanding simple statements.  Sudden problems with walking or balance.  A sudden, severe headache that is different from past headaches. You passed out (lost consciousness) again.  Watch closely for changes in your health, and be sure to contact your doctor if:  You do not get better as expected.   How can you care for yourself at home?  Drink plenty of fluids to prevent dehydration. If you have kidney, heart, or liver disease and have to limit fluids, talk with your doctor before you increase your fluid intake.

## 2015-09-11 NOTE — ED Notes (Signed)
Patient states she woke up this morning feeling weak and tired. Patient states her legs felt like "jelly." Patient states she has been feeling weak and tired for the last few weeks, but it became worse today. Patient states she took a shower this am and woke up on the floor outside of the shower. Patient denies any injuries. Patient is alert and oriented, moves extremities equally and has equal grip.  Face is symmetrical.

## 2015-09-11 NOTE — ED Provider Notes (Signed)
[facility] Emergency Department Provider Note  ____________________________________________  Time seen: Approximately 12:25 PM  I have reviewed the triage vital signs and the nursing notes.   HISTORY  Chief Complaint Near Syncope   HPI Erin Lewis is a 60 y.o. female with a history of hypertension and depression who presents for evaluation of a syncopal episode. Patient reports that she has been very depressed over the course of the last month. She reports that she hasn't left the house in the last week, has not had an appetite, has had decreased drinking and eating. She reports that her and her husband are having marital problems although she denies any physical or sexual abuse. She reports that she took herself off of her Effexor 6 months ago because she was gaining weight. She has not seen her psychiatrist since then. She also reports that she ran out of her antihypertensive medications and has not seen her primary care for refill. She reports that she's been feeling very fatigued and weak over the course of the last week and today went in the shower to get ready to come to the emergency department when she felt dizzy and had a syncopal episode. She denies injuring herself during the fall. She is not on any anticoagulants. Patient denies having chest pain, palpitations, shortness of breath, or headache preceding or after her syncopal episode. She denies any prior history of syncope. She denies any current headache, neck pain, back pain, shortness of breath, chest pain, palpitations, abdominal pain. Patient denies any suicidal or homicidal ideation. She denies any prior history of suicidal attempts  Past Medical History  Diagnosis Date  . Hypertension     Patient Active Problem List   Diagnosis Date Noted  . Major depressive disorder, recurrent, severe without psychotic features ([place]) 10/07/2014  . Anxiety reaction 10/07/2014    Past Surgical History    Procedure Laterality Date  . Cesarean section      x2  . Laproscopy      abdomen  . Shoulder arthroscopy w/ rotator cuff repair      x2  . Total shoulder arthroplasty Right     Current Outpatient Rx  Name  Route  Sig  Dispense  Refill  . aspirin 325 MG tablet   Oral   Take 325 mg by mouth daily.         . diphenhydrAMINE (BENADRYL) 25 mg capsule   Oral   Take 25 mg by mouth every 6 (six) hours as needed.         . hydrochlorothiazide (HYDRODIURIL) 12.5 MG tablet   Oral   Take 1 tablet (12.5 mg total) by mouth daily.   30 tablet   1   . venlafaxine XR (EFFEXOR-XR) 75 MG 24 hr capsule   Oral   Take 75 mg by mouth daily. Reported on 09/11/2015      2     Allergies Review of patient's allergies indicates no known allergies.  No family history on file.  Social History Social History  Substance Use Topics  . Smoking status: Current Every Day Smoker -- 0.50 packs/day    Types: Cigarettes  . Smokeless tobacco: None  . Alcohol Use: No    Review of Systems  Constitutional: Negative for fever. + Syncope, fatigue, depression Eyes: Negative for visual changes. ENT: Negative for sore throat. Cardiovascular: Negative for chest pain. Respiratory: Negative for shortness of breath. Gastrointestinal: Negative for abdominal pain, vomiting or diarrhea. + Anorexia Genitourinary: Negative for  dysuria. Musculoskeletal: Negative for back pain. Skin: Negative for rash. Neurological: Negative for headaches, weakness or numbness.  ____________________________________________   PHYSICAL EXAM:  VITAL SIGNS: ED Triage Vitals  Enc Vitals Group     BP 09/11/15 1137 182/100 mmHg     Pulse Rate 09/11/15 1137 89     Resp 09/11/15 1137 18     Temp 09/11/15 1137 98.5 F (36.9 C)     Temp Source 09/11/15 1137 Oral     SpO2 09/11/15 1137 100 %     Weight 09/11/15 1137 170 lb (77.111 kg)     Height 09/11/15 1137 5\' 5"  (1.651 m)     Head Cir --      Peak Flow --      Pain  Score 09/11/15 1137 6     Pain Loc --      Pain Edu? --      Excl. in [place]? --     Constitutional: Alert and oriented. Well appearing and in no apparent distress. HEENT:      Head: Normocephalic and atraumatic.         Eyes: Conjunctivae are normal. Sclera is non-icteric. EOMI. PERRL      Mouth/Throat: Mucous membranes are moist.       Neck: Supple with no signs of meningismus.No C-spine tenderness Cardiovascular: Regular rate and rhythm. No murmurs, gallops, or rubs. 2+ symmetrical distal pulses are present in all extremities. No JVD. Respiratory: Normal respiratory effort. Lungs are clear to auscultation bilaterally. No wheezes, crackles, or rhonchi.  Gastrointestinal: Soft, non tender, and non distended with positive bowel sounds. No rebound or guarding. Genitourinary: No suprapubic tenderness. No CVA tenderness. Musculoskeletal: Nontender with normal range of motion in all extremities. No edema, cyanosis, or erythema of extremities. No T and L-spine tenderness Neurologic: Normal speech and language. Face is symmetric. Moving all extremities. No gross focal neurologic deficits are appreciated. Skin: Skin is warm, dry and intact. No rash noted. Psychiatric: Mood and affect are depressed. Speech and behavior are normal. Teary, good eye contact. No Si or HI  ____________________________________________   LABS (all labs ordered are listed, but only abnormal results are displayed)  Labs Reviewed  BASIC METABOLIC PANEL - Abnormal; Notable for the following:    Glucose, Bld 103 (*)    BUN 21 (*)    All other components within normal limits  CBC - Abnormal; Notable for the following:    Hemoglobin 16.3 (*)    RDW 14.8 (*)    All other components within normal limits  URINALYSIS COMPLETEWITH MICROSCOPIC ([facility] ONLY) - Abnormal; Notable for the following:    Color, Urine YELLOW (*)    APPearance CLEAR (*)    Hgb urine dipstick 2+ (*)    Leukocytes, UA 1+ (*)    Squamous Epithelial /  LPF 0-5 (*)    All other components within normal limits   ____________________________________________  EKG  ED ECG REPORT I, Rudene Re, the attending physician, personally viewed and interpreted this ECG.  Normal sinus rhythm, normal intervals, normal axis, no STE or depressions, no evidence of HOCM, AV block, delta wave, ARVD, prolonged QTc, WPW.  ____________________________________________  RADIOLOGY  None  ____________________________________________   PROCEDURES  Procedure(s) performed: None Critical Care performed:  None ____________________________________________   INITIAL IMPRESSION / ASSESSMENT AND PLAN / ED COURSE  60 y.o. female with a history of hypertension and depression who presents for evaluation of a syncopal episode in the setting of worsening depressive symptoms and decreased oral  intake over the course of the last week. Patient were no injuries on exam, has no pain anywhere. Her neuro exam is intact, physical exam is otherwise reassuring. Her EKG does not show any evidence of arrhythmia, ischemia, regarding other causes of her syncope. Labs areof anemia, normal kidney function. Patient received IV fluids with improvement of her fatigue and weakness. Patient was restarted on her hydrochlorothiazide. No indication for IVC for her depressive symptoms as patient is not homicidal or suicidal. Have encouraged her to reestablish care with her psychiatrist. I am also referring her to [facility] clinic for primary care.   Pertinent labs & imaging results that were available during my care of the patient were reviewed by me and considered in my medical decision making (see chart for details).    I discussed my evaluation of the patient's symptoms, my clinical impression, and my proposed outpatient treatment plan with patient. We have discussed anticipatory guidance, scheduled follow-up, and careful return precautions. The patient expresses understanding and is  comfortable with the discharge plan. All patient's questions were answered.   ____________________________________________   FINAL CLINICAL IMPRESSION(S) / ED DIAGNOSES  Final diagnoses:  Syncope, unspecified syncope type      NEW MEDICATIONS STARTED DURING THIS VISIT:  New Prescriptions   HYDROCHLOROTHIAZIDE (HYDRODIURIL) 12.5 MG TABLET    Take 1 tablet (12.5 mg total) by mouth daily.     Note:  This document was prepared using Dragon voice recognition software and may include unintentional dictation errors.    Rudene Re, MD 09/11/15 1436

## 2016-03-11 ENCOUNTER — Emergency Department: Payer: Medicare HMO

## 2016-03-11 ENCOUNTER — Inpatient Hospital Stay
Admission: EM | Admit: 2016-03-11 | Discharge: 2016-03-11 | DRG: 948 | Discharge status: Against Medical Advice | Payer: Medicare HMO | Attending: Internal Medicine | Admitting: Internal Medicine

## 2016-03-11 ENCOUNTER — Encounter: Payer: Self-pay | Admitting: Emergency Medicine

## 2016-03-11 DIAGNOSIS — R4701 Aphasia: Secondary | ICD-10-CM | POA: Diagnosis present

## 2016-03-11 DIAGNOSIS — R449 Unspecified symptoms and signs involving general sensations and perceptions: Secondary | ICD-10-CM | POA: Diagnosis present

## 2016-03-11 DIAGNOSIS — R4189 Other symptoms and signs involving cognitive functions and awareness: Secondary | ICD-10-CM

## 2016-03-11 DIAGNOSIS — Z7982 Long term (current) use of aspirin: Secondary | ICD-10-CM | POA: Diagnosis not present

## 2016-03-11 DIAGNOSIS — R531 Weakness: Secondary | ICD-10-CM | POA: Diagnosis not present

## 2016-03-11 DIAGNOSIS — R0789 Other chest pain: Secondary | ICD-10-CM | POA: Diagnosis present

## 2016-03-11 DIAGNOSIS — F332 Major depressive disorder, recurrent severe without psychotic features: Secondary | ICD-10-CM | POA: Diagnosis present

## 2016-03-11 DIAGNOSIS — Z833 Family history of diabetes mellitus: Secondary | ICD-10-CM

## 2016-03-11 DIAGNOSIS — I10 Essential (primary) hypertension: Secondary | ICD-10-CM | POA: Diagnosis present

## 2016-03-11 DIAGNOSIS — R200 Anesthesia of skin: Secondary | ICD-10-CM | POA: Diagnosis present

## 2016-03-11 DIAGNOSIS — R29898 Other symptoms and signs involving the musculoskeletal system: Secondary | ICD-10-CM | POA: Diagnosis present

## 2016-03-11 DIAGNOSIS — F17210 Nicotine dependence, cigarettes, uncomplicated: Secondary | ICD-10-CM | POA: Diagnosis present

## 2016-03-11 DIAGNOSIS — Z8249 Family history of ischemic heart disease and other diseases of the circulatory system: Secondary | ICD-10-CM

## 2016-03-11 DIAGNOSIS — Z96611 Presence of right artificial shoulder joint: Secondary | ICD-10-CM | POA: Diagnosis present

## 2016-03-11 DIAGNOSIS — Z79899 Other long term (current) drug therapy: Secondary | ICD-10-CM | POA: Diagnosis not present

## 2016-03-11 LAB — COMPREHENSIVE METABOLIC PANEL
ALBUMIN: 4.3 g/dL (ref 3.5–5.0)
ALT: 19 U/L (ref 14–54)
AST: 27 U/L (ref 15–41)
Alkaline Phosphatase: 58 U/L (ref 38–126)
Anion gap: 6 (ref 5–15)
BILIRUBIN TOTAL: 0.8 mg/dL (ref 0.3–1.2)
BUN: 28 mg/dL — AB (ref 6–20)
CHLORIDE: 105 mmol/L (ref 101–111)
CO2: 23 mmol/L (ref 22–32)
Calcium: 9.4 mg/dL (ref 8.9–10.3)
Creatinine, Ser: 1.14 mg/dL — ABNORMAL HIGH (ref 0.44–1.00)
GFR calc Af Amer: 59 mL/min — ABNORMAL LOW (ref >60)
GFR calc non Af Amer: 51 mL/min — ABNORMAL LOW (ref >60)
GLUCOSE: 89 mg/dL (ref 65–99)
POTASSIUM: 4.1 mmol/L (ref 3.5–5.1)
Sodium: 134 mmol/L — ABNORMAL LOW (ref 135–145)
TOTAL PROTEIN: 7.9 g/dL (ref 6.5–8.1)

## 2016-03-11 LAB — PROTIME-INR
INR: 1
PROTHROMBIN TIME: 13.2 s (ref 11.4–15.2)

## 2016-03-11 LAB — DIFFERENTIAL
BASOS ABS: 0 10*3/uL (ref 0–0.1)
Basophils Relative: 0 %
EOS ABS: 0.2 10*3/uL (ref 0–0.7)
Eosinophils Relative: 3 %
LYMPHS ABS: 2.2 10*3/uL (ref 1.0–3.6)
Lymphocytes Relative: 37 %
Monocytes Absolute: 0.3 10*3/uL (ref 0.2–0.9)
Monocytes Relative: 5 %
NEUTROS PCT: 55 %
Neutro Abs: 3.3 10*3/uL (ref 1.4–6.5)

## 2016-03-11 LAB — CBC
HEMATOCRIT: 45.7 % (ref 35.0–47.0)
Hemoglobin: 15.5 g/dL (ref 12.0–16.0)
MCH: 31.6 pg (ref 26.0–34.0)
MCHC: 33.8 g/dL (ref 32.0–36.0)
MCV: 93.6 fL (ref 80.0–100.0)
PLATELETS: 257 10*3/uL (ref 150–440)
RBC: 4.89 MIL/uL (ref 3.80–5.20)
RDW: 14.6 % — AB (ref 11.5–14.5)
WBC: 6 10*3/uL (ref 3.6–11.0)

## 2016-03-11 LAB — APTT: aPTT: 29 seconds (ref 24–36)

## 2016-03-11 LAB — TROPONIN I: Troponin I: <0.03 ng/mL (ref <0.03)

## 2016-03-11 LAB — GLUCOSE, CAPILLARY: Glucose-Capillary: 87 mg/dL (ref 65–99)

## 2016-03-11 NOTE — Progress Notes (Signed)
[place] paged for a stroke Pt in Rm25. [place] arrived, but Pt had been taken to CT scan; Pt returned, [place] talked to Pt, Pt did not want family members to know she was in hospital. The visit was interrupted by the Nurse, [facility] visited Pt again at 2:39pm. Pt told [place] she was going through a lot of things, was tearful, and needed prayer support, which [place] provided.    03/11/16 1600  Clinical Encounter Type  Visited With Patient  Visit Type Initial;Follow-up  Referral From Nurse  Consult/Referral To Chaplain  Spiritual Encounters  Spiritual Needs Prayer;Other (Comment)

## 2016-03-11 NOTE — ED Notes (Signed)
Soc  Report  Given  To  DR PADUCHOWSKI

## 2016-03-11 NOTE — ED Notes (Signed)
Patient transported to CT by Annie Main, RN

## 2016-03-11 NOTE — ED Notes (Signed)
After speaking with Dr. Jannifer Franklin, pt still persistent that she is leaving, attempting to take IV out with Dr. Jannifer Franklin is speaking with her.  Explained to patient that without further evaluation and treatment, her condition could be life threatening; pt verbally expresses understanding, stating, "I will take my chances."  PIV removed and AMA form signed.  Pt A/Ox4, ambulatory without any assist, vitals WDL upon departure.  MD aware.

## 2016-03-11 NOTE — ED Notes (Signed)
In the middle of calling report; ED Medic comes out of pt's room and reports that pt is stating that she wants to go home.  In to pt room at this time; explained that she has a ready bed and she will be transported soon.  Pt continues to repeat, "I just want to go home."  Admitting MD made aware and to bedside at this time.

## 2016-03-11 NOTE — H&P (Signed)
[place] at [place] NAME: Erin Lewis    MR#:  [id number]  DATE OF BIRTH:  1955-10-22  DATE OF ADMISSION:  03/11/2016  PRIMARY CARE PHYSICIAN: No PCP Per Patient   REQUESTING/REFERRING PHYSICIAN: Kerman Passey, MD  CHIEF COMPLAINT:   Chief Complaint  Patient presents with  . Code Stroke    HISTORY OF PRESENT ILLNESS:  Erin Lewis  is a 61 y.o. female who presents with Left hemisensory deficit and left lower extremity hemiparesis, as well as some expressive aphasia. Patient states that around 8:00 this morning she noticed the onset of left facial and upper extremity numbness. She also then developed left lower extremity numbness and weakness. She states that over the course of 4-5 hours she took 6-7 baby aspirin, without any improvement in her symptoms. She then came to the ED for evaluation. Initial workup here is largely negative with labs largely within normal limits and CT scan negative. However, patient has persistence of symptoms. Here she was noted to have some expressive aphasia.  Patient states that she was put on full aspirin in the past by her PCP, she did have a similar episode to this several years ago where she was admitted but had negative MRI at that time. Recently she ran out of her full strength aspirins and was only taking baby aspirin. Hospitalists were called for admission and further evaluation.  PAST MEDICAL HISTORY:   Past Medical History:  Diagnosis Date  . Hypertension     PAST SURGICAL HISTORY:   Past Surgical History:  Procedure Laterality Date  . CESAREAN SECTION     x2  . laproscopy     abdomen  . SHOULDER ARTHROSCOPY W/ ROTATOR CUFF REPAIR     x2  . TOTAL SHOULDER ARTHROPLASTY Right     SOCIAL HISTORY:   Social History  Substance Use Topics  . Smoking status: Current Every Day Smoker    Packs/day: 0.50    Types: Cigarettes  . Smokeless tobacco: Never Used  . Alcohol use No    FAMILY  HISTORY:   Family History  Problem Relation Age of Onset  . Hypertension Other   . Diabetes Other     DRUG ALLERGIES:  No Known Allergies  MEDICATIONS AT HOME:   Prior to Admission medications   Medication Sig Start Date End Date Taking? Authorizing Provider  aspirin 81 MG chewable tablet Chew 81 mg by mouth daily.   Yes Historical Provider, MD  atorvastatin (LIPITOR) 20 MG tablet Take 20 mg by mouth daily.   Yes Historical Provider, MD  diphenhydrAMINE (BENADRYL) 25 mg capsule Take 25 mg by mouth every 6 (six) hours as needed.   Yes Historical Provider, MD  ergocalciferol (VITAMIN D2) 50000 units capsule Take 50,000 Units by mouth once a week.   Yes Historical Provider, MD  losartan-hydrochlorothiazide (HYZAAR) 100-25 MG tablet Take 1 tablet by mouth daily.   Yes Historical Provider, MD  venlafaxine XR (EFFEXOR-XR) 75 MG 24 hr capsule Take 75 mg by mouth daily. Reported on 09/11/2015 10/03/14  Yes Historical Provider, MD  vitamin B-12 (CYANOCOBALAMIN) 1000 MCG tablet Take 1,000 mcg by mouth daily.   Yes Historical Provider, MD  zaleplon (SONATA) 10 MG capsule Take 10 mg by mouth daily. 10/03/14  Yes Historical Provider, MD  aspirin 325 MG tablet Take 325 mg by mouth daily.    Historical Provider, MD  hydrochlorothiazide (HYDRODIURIL) 12.5 MG tablet Take 1 tablet (12.5 mg total) by mouth daily.  Patient not taking: Reported on 03/11/2016 09/11/15   Rudene Re, MD    REVIEW OF SYSTEMS:  Review of Systems  Constitutional: Negative for chills, fever, malaise/fatigue and weight loss.  HENT: Negative for ear pain, hearing loss and tinnitus.   Eyes: Negative for blurred vision, double vision, pain and redness.  Respiratory: Negative for cough, hemoptysis and shortness of breath.   Cardiovascular: Negative for chest pain, palpitations, orthopnea and leg swelling.  Gastrointestinal: Negative for abdominal pain, constipation, diarrhea, nausea and vomiting.  Genitourinary: Negative for  dysuria, frequency and hematuria.  Musculoskeletal: Negative for back pain, joint pain and neck pain.  Skin:       No acne, rash, or lesions  Neurological: Positive for sensory change, speech change and focal weakness. Negative for dizziness, tremors and weakness.  Endo/Heme/Allergies: Negative for polydipsia. Does not bruise/bleed easily.  Psychiatric/Behavioral: Negative for depression. The patient is not nervous/anxious and does not have insomnia.      VITAL SIGNS:   Vitals:   03/11/16 1356 03/11/16 1430 03/11/16 1434 03/11/16 1500  BP: (!) 134/96 115/74  116/72  Pulse: 91 82  78  Resp: 15 14  20   Temp:   98.6 F (37 C)   SpO2: 97% 97%  100%  Weight:      Height:       Wt Readings from Last 3 Encounters:  03/11/16 77.1 kg (170 lb)  09/11/15 77.1 kg (170 lb)    PHYSICAL EXAMINATION:  Physical Exam  Vitals reviewed. Constitutional: She is oriented to person, place, and time. She appears well-developed and well-nourished. No distress.  HENT:  Head: Normocephalic and atraumatic.  Mouth/Throat: Oropharynx is clear and moist.  Eyes: Conjunctivae and EOM are normal. Pupils are equal, round, and reactive to light. No scleral icterus.  Neck: Normal range of motion. Neck supple. No JVD present. No thyromegaly present.  Cardiovascular: Normal rate, regular rhythm and intact distal pulses.  Exam reveals no gallop and no friction rub.   No murmur heard. Respiratory: Effort normal and breath sounds normal. No respiratory distress. She has no wheezes. She has no rales.  GI: Soft. Bowel sounds are normal. She exhibits no distension. There is no tenderness.  Musculoskeletal: Normal range of motion. She exhibits no edema.  No arthritis, no gout  Lymphadenopathy:    She has no cervical adenopathy.  Neurological: She is alert and oriented to person, place, and time. No cranial nerve deficit.  Neurologic: Cranial nerves II-XII intact, Sensation intact to light touch/pinprick throughout  except for decreased sensation to light touch over her left facial upper and lower quadrants and her left upper extremity, 5/5 strength in all extremities except for left lower extremity which has 4-/5 strength, of note, her right shoulder has been chronically weak since right arthroplasty but that extremity otherwise has 5/5 strength, no dysarthria, some expressive aphasia, memory intact.  Skin: Skin is warm and dry. No rash noted. No erythema.  Psychiatric: She has a normal mood and affect. Her behavior is normal. Judgment and thought content normal.    LABORATORY PANEL:   CBC  Recent Labs Lab 03/11/16 1341  WBC 6.0  HGB 15.5  HCT 45.7  PLT 257   ------------------------------------------------------------------------------------------------------------------  Chemistries   Recent Labs Lab 03/11/16 1341  NA 134*  K 4.1  CL 105  CO2 23  GLUCOSE 89  BUN 28*  CREATININE 1.14*  CALCIUM 9.4  AST 27  ALT 19  ALKPHOS 58  BILITOT 0.8   ------------------------------------------------------------------------------------------------------------------  Cardiac  Enzymes  Recent Labs Lab 03/11/16 1341  TROPONINI <0.03   ------------------------------------------------------------------------------------------------------------------  RADIOLOGY:  Ct Head Code Stroke W/o Cm  Result Date: 03/11/2016 CLINICAL DATA:  Code stroke.  Left facial and arm numbness. EXAM: CT HEAD WITHOUT CONTRAST TECHNIQUE: Contiguous axial images were obtained from the base of the skull through the vertex without intravenous contrast. COMPARISON:  MRI 09/04/2013, CT 09/03/2013 FINDINGS: Brain: No evidence of acute infarction, hemorrhage, hydrocephalus, extra-axial collection or mass lesion/mass effect. Vascular: No hyperdense vessel or unexpected calcification. Skull: Negative Sinuses/Orbits: Negative Other: None ASPECTS ([facility] Stroke Program Early CT Score) - Ganglionic level infarction (caudate,  lentiform nuclei, internal capsule, insula, M1-M3 cortex): 7 - Supraganglionic infarction (M4-M6 cortex): 3 Total score (0-10 with 10 being normal): 10 IMPRESSION: 1. Negative CT of the head 2. ASPECTS is 10 These results were called by telephone at the time of interpretation on 03/11/2016 at 1:54 pm to Dr. Harvest Dark , who verbally acknowledged these results. Electronically Signed   By: Franchot Gallo M.D.   On: 03/11/2016 13:54    EKG:   Orders placed or performed during the hospital encounter of 03/11/16  . EKG 12-Lead  . EKG 12-Lead  . EKG 12-Lead  . EKG 12-Lead  . ED EKG  . ED EKG    IMPRESSION AND PLAN:  Principal Problem:   Sensory deficit, left - strong suspicion for possible stroke. We will admit her with stroke order set in place with appropriate labs, imaging including MRI, consults including neurology consult. Active Problems:   Expressive aphasia - see treatment plan above   Weakness of left lower extremity - see treatment plan above   HTN (hypertension) - permissive hypertension for the first 24 hours after onset of symptoms which would be until 8:00 tomorrow morning. Blood pressure goal until then will be less than 220/120. Hold home antihypertensives, treat blood pressure when necessary if above stated goal.   Major depressive disorder, recurrent, severe without psychotic features ([place]) - continue home meds  All the records are reviewed and case discussed with ED provider. Management plans discussed with the patient and/or family.  DVT PROPHYLAXIS: SubQ lovenox  GI PROPHYLAXIS: None  ADMISSION STATUS: Inpatient  CODE STATUS: Full Code Status History    This patient does not have a recorded code status. Please follow your organizational policy for patients in this situation.      TOTAL TIME TAKING CARE OF THIS PATIENT: 45 minutes.    Erin Lewis FIELDING 03/11/2016, 3:48 PM  [facility]  Office  [phone]  CC: Primary care  physician; No PCP Per Patient

## 2016-03-11 NOTE — Progress Notes (Signed)
Called to speak with the patient and she expects concerns of wanting to leave the hospital. Discussed with patient her reasons for one to leave rather than starting to get full diagnostic workup and treatment recommendations for her likely stroke. Patient was not very forthcoming with her reasons for not wanting to stay. She was able to express back to me very clearly that she understood that leaving was [place] and that leaving prior to diagnosing her stroke accurately and giving appropriate subsequent therapy recommendations put her at risk for repeat ischemic insult, possibly with much more grave consequences including even potentially fatal consequences. Patient insisted on wanting to go home. She was completely alert and oriented. AMA paperwork was completed by nursing staff and patient left from the ED.  Erin Lewis [facility] 03/11/2016, 5:31 PM

## 2016-03-11 NOTE — ED Provider Notes (Addendum)
[facility] Emergency Department Provider Note  Time seen: 1:58 PM  I have reviewed the triage vital signs and the nursing notes.   HISTORY  Chief Complaint Code Stroke    HPI Erin Lewis is a 61 y.o. female with a past medical history of hypertension who presents to the emergency department as a code stroke. According to the patient since 8:00 this morning she has been experiencing numbness feelings in her left face arm and leg. In the emergency department she has also developed difficulty speaking. States she was also having chest discomfort on and off today. Denies any chest discomfort currently. States she has taken 8 baby aspirins today. Denies any history of stroke. Patient does have a history of right upper extremity weakness due to a prior shoulder surgery per patient. Denies any known heart attacks. Code stroke protocols initiated.  Past Medical History:  Diagnosis Date  . Hypertension     Patient Active Problem List   Diagnosis Date Noted  . Major depressive disorder, recurrent, severe without psychotic features ([place]) 10/07/2014  . Anxiety reaction 10/07/2014    Past Surgical History:  Procedure Laterality Date  . CESAREAN SECTION     x2  . laproscopy     abdomen  . SHOULDER ARTHROSCOPY W/ ROTATOR CUFF REPAIR     x2  . TOTAL SHOULDER ARTHROPLASTY Right     Prior to Admission medications   Medication Sig Start Date End Date Taking? Authorizing Provider  aspirin 325 MG tablet Take 325 mg by mouth daily.    Historical Provider, MD  diphenhydrAMINE (BENADRYL) 25 mg capsule Take 25 mg by mouth every 6 (six) hours as needed.    Historical Provider, MD  hydrochlorothiazide (HYDRODIURIL) 12.5 MG tablet Take 1 tablet (12.5 mg total) by mouth daily. 09/11/15   Rudene Re, MD  venlafaxine XR (EFFEXOR-XR) 75 MG 24 hr capsule Take 75 mg by mouth daily. Reported on 09/11/2015 10/03/14   Historical Provider, MD    No Known Allergies  No family  history on file.  Social History Social History  Substance Use Topics  . Smoking status: Current Every Day Smoker    Packs/day: 0.50    Types: Cigarettes  . Smokeless tobacco: Never Used  . Alcohol use No    Review of Systems Constitutional: Negative for fever. Cardiovascular: Intermittent right-sided chest pain. Respiratory: Negative for shortness of breath. Gastrointestinal: Negative for abdominal pain Neurological: Negative for headache. Patient states increased numbness over her left face, arm and leg. 10-point ROS otherwise negative.  ____________________________________________   PHYSICAL EXAM:  Constitutional: Alert and oriented. Well appearing and in no distress. Eyes: Normal exam ENT   Head: Normocephalic and atraumatic.   Mouth/Throat: Mucous membranes are moist. Cardiovascular: Normal rate, regular rhythm. No murmur Respiratory: Normal respiratory effort without tachypnea nor retractions. Breath sounds are clear Gastrointestinal: Soft and nontender. No distention. Musculoskeletal: Nontender with normal range of motion in all extremities.  Neurologic:  Minimal difficulty speaking but normal language. Patient does have weakness in left lower extremity, equal grip strengths in the upper extremities. States mild sensory deficit and right upper extremity, but states this is chronic. States moderate sensory deficit in left lower extremity. No change in facial sensation. Cranial nerves intact on examination. No pronator drift. Skin:  Skin is warm, dry and intact.  Psychiatric: Mood and affect are normal.   ____________________________________________    EKG  EKG reviewed and interpreted by myself shows normal sinus rhythm at 90 bpm, narrow QRS,  normal axis, normal intervals.  ____________________________________________    RADIOLOGY  CT head negative  ____________________________________________   INITIAL IMPRESSION / ASSESSMENT AND PLAN / ED  COURSE  Pertinent labs & imaging results that were available during my care of the patient were reviewed by me and considered in my medical decision making (see chart for details).  Patient presents the emergency department as a code stroke. On examination the patient does have subjective sensory loss in the left lower extremity also has left lower extremity drift after 3 seconds. Patient has been seen by neurology, patient is outside of the TPA window the symptoms began at 8:00 this morning. They do recommend admission to the hospital for stroke workup including MRI. Currently awaiting labs. CT is negative.  Patient's medical workup in the emergency department is largely negative. Patient will be admitted to the hospital for further evaluation. Outside of TPA window. Patient has taken aspirin prior to arrival.   NIH Stroke Scale   Interval: upon arrival Time: around 2 pm Person Administering Scale: Layci Stenglein, Lennette Bihari  Administer stroke scale items in the order listed. Record performance in each category after each subscale exam. Do not go back and change scores. Follow directions provided for each exam technique. Scores should reflect what the patient does, not what the clinician thinks the patient can do. The clinician should record answers while administering the exam and work quickly. Except where indicated, the patient should not be coached (i.e., repeated requests to patient to make a special effort).   1a  Level of consciousness: 0=alert; keenly responsive  1b. LOC questions:  0=Performs both tasks correctly  1c. LOC commands: 0=Performs both tasks correctly  2.  Best Gaze: 0=normal  3.  Visual: 0=No visual loss  4. Facial Palsy: 0=Normal symmetric movement  5a.  Motor left arm: 0=No drift, limb holds 90 (or 45) degrees for full 10 seconds  5b.  Motor right arm: 1=Drift, limb holds 90 (or 45) degrees but drifts down before full 10 seconds: does not hit bed  6a. motor left leg: 3=No  effort against gravity, limb falls  6b  Motor right leg:  0=No drift, limb holds 90 (or 45) degrees for full 10 seconds  7. Limb Ataxia: 1=Present in one limb  8.  Sensory: 1=Mild to moderate sensory loss; patient feels pinprick is less sharp or is dull on the affected side; there is a loss of superficial pain with pinprick but patient is aware She is being touched  9. Best Language:  1=Mild to moderate aphasia; some obvious loss of fluency or facility of comprehension without significant limitation on ideas expressed or form of expression.  10. Dysarthria: 1=Mild to moderate, patient slurs at least some words and at worst, can be understood with some difficulty  11. Extinction and Inattention: 0=No abnormality  12. Distal motor function: 0=Normal   Total:   8    ____________________________________________   FINAL CLINICAL IMPRESSION(S) / ED DIAGNOSES  Left-sided numbness    Harvest Dark, MD 03/11/16 1502    Harvest Dark, MD 03/11/16 [phone]

## 2016-03-11 NOTE — ED Triage Notes (Signed)
Patient states that this morning at 0800 she started having chest pain and numbness in her left arm and left side of face. No facial droop noted in triage, grip strength equal bilaterally.

## 2016-06-28 ENCOUNTER — Encounter: Payer: Self-pay | Admitting: *Deleted

## 2016-07-05 ENCOUNTER — Encounter: Payer: Self-pay | Admitting: *Deleted

## 2016-07-11 ENCOUNTER — Ambulatory Visit (INDEPENDENT_AMBULATORY_CARE_PROVIDER_SITE_OTHER): Payer: Medicare HMO | Admitting: General Surgery

## 2016-07-11 ENCOUNTER — Encounter: Payer: Self-pay | Admitting: General Surgery

## 2016-07-11 VITALS — BP 138/74 | HR 82 | Resp 14 | Ht 65.5 in | Wt 178.0 lb

## 2016-07-11 DIAGNOSIS — K625 Hemorrhage of anus and rectum: Secondary | ICD-10-CM

## 2016-07-11 MED ORDER — POLYETHYLENE GLYCOL 3350 17 GM/SCOOP PO POWD: ORAL | 0 refills | Status: DC

## 2016-07-11 NOTE — Progress Notes (Addendum)
Patient ID: Erin Lewis, female   DOB: [birth year], 61 y.o.   MRN: [id number]  Chief Complaint  Patient presents with  . Colonoscopy    HPI Erin Lewis is a 61 y.o. female here for discussion about having a colonoscopy done. She has not had one prior. She reports that she has seen some blood in her stool about 2 weeks ago. This was bright red on the toilet paper. No associated pain. She has not had any since. She says that in the last few months she has been having loose, but formed stools, but not diarrhea. She uses the bathroom daily.   HPI  Past Medical History:  Diagnosis Date  . Hyperlipidemia   . Hypertension     Past Surgical History:  Procedure Laterality Date  . [facility]  . CESAREAN SECTION     x2  . laproscopy     abdomen  . SHOULDER ARTHROSCOPY W/ ROTATOR CUFF REPAIR     x2  . TOTAL SHOULDER ARTHROPLASTY Right 6-7 years ago    Family History  Problem Relation Age of Onset  . Hypertension Other   . Diabetes Other   . Colon polyps Father 56    Social History Social History  Substance Use Topics  . Smoking status: Current Every Day Smoker    Packs/day: 0.50    Types: Cigarettes  . Smokeless tobacco: Never Used  . Alcohol use No    No Known Allergies  Current Outpatient Prescriptions  Medication Sig Dispense Refill  . aspirin 325 MG tablet Take 325 mg by mouth daily.    Marland Kitchen atorvastatin (LIPITOR) 20 MG tablet Take 20 mg by mouth daily.    . cetirizine (ZYRTEC) 10 MG tablet Take 10 mg by mouth.    . ergocalciferol (VITAMIN D2) 50000 units capsule Take 50,000 Units by mouth once a week.    . losartan-hydrochlorothiazide (HYZAAR) 100-25 MG tablet Take 1 tablet by mouth daily.    . polyethylene glycol powder (GLYCOLAX/MIRALAX) powder 255 grams one bottle for colonoscopy prep 255 g 0   No current facility-administered medications for this visit.     Review of Systems Review of Systems  Constitutional: Negative.   Negative for chills, diaphoresis, fatigue, fever and unexpected weight change.  Respiratory: Negative.   Cardiovascular: Negative.     Blood pressure 138/74, pulse 82, resp. rate 14, height 5' 5.5" (1.664 m), weight 178 lb (80.7 kg).  Physical Exam Physical Exam  Constitutional: She is oriented to person, place, and time. She appears well-developed and well-nourished.  Eyes: Conjunctivae are normal. No scleral icterus.  Neck: Neck supple.  Cardiovascular: Normal rate, regular rhythm and normal heart sounds.   Pulmonary/Chest: Effort normal and breath sounds normal.  Abdominal: Soft. Normal appearance. There is no tenderness.  Lymphadenopathy:    She has no cervical adenopathy.  Neurological: She is alert and oriented to person, place, and time.  Skin: Skin is warm and dry.  Psychiatric: She has a normal mood and affect.    Data Reviewed Laboratory studies dated 03/11/2016 showed a modest elevation of serum creatinine 1.14 with an estimated GFR 59, sodium 134. Hemoglobin 15.5 with an MCV of 94, white blood cell count 6000 with a normal differential. Platelets of 257,000.  Evaluation January 2018 for possible CVA. CT was negative.  Assessment    History rectal bleeding 1.  Mild change in stool, softer without mucus.    Plan  Colonoscopy with possible biopsy/polypectomy prn: Information regarding the procedure, including its potential risks and complications (including but not limited to perforation of the bowel, which may require emergency surgery to repair, and bleeding) was verbally given to the patient. Educational information regarding lower intestinal endoscopy was given to the patient. Written instructions for how to complete the bowel prep using Miralax were provided. The importance of drinking ample fluids to avoid dehydration as a result of the prep emphasized.   HPI, Physical Exam, Assessment and Plan have been scribed under the direction and in the presence of  Robert Bellow, MD  Concepcion Living, LPN  I have completed the exam and reviewed the above documentation for accuracy and completeness.  I agree with the above.  Haematologist has been used and any errors in dictation or transcription are unintentional.  Hervey Ard, M.D., F.A.C.S.   Robert Bellow 07/11/2016, 5:34 PM  Patient has been scheduled for a colonoscopy on 07-20-16 at [facility]. This patient has been asked to decrease current 325 mg aspirin to 81 mg aspirin starting one week prior to procedure. Miralax prescription has been sent in to the patient's pharmacy today. Colonoscopy instructions have been reviewed with the patient. This patient is aware to call the office if they have further questions.   Dominga Ferry, CMA

## 2016-07-11 NOTE — Patient Instructions (Addendum)
Colonoscopy, Adult A colonoscopy is an exam to look at the large intestine. It is done to check for problems, such as:  Lumps (tumors).  Growths (polyps).  Swelling (inflammation).  Bleeding. What happens before the procedure? Eating and drinking  Follow instructions from your doctor about eating and drinking. These instructions may include:  A few days before the procedure - follow a low-fiber diet.  Avoid nuts.  Avoid seeds.  Avoid dried fruit.  Avoid raw fruits.  Avoid vegetables.  1-3 days before the procedure - follow a clear liquid diet. Avoid liquids that have red or purple dye. Drink only clear liquids, such as:  Clear broth or bouillon.  Black coffee or tea.  Clear juice.  Clear soft drinks or sports drinks.  Gelatin dessert.  Popsicles.  On the day of the procedure - do not eat or drink anything during the 2 hours before the procedure. Bowel prep  If you were prescribed an oral bowel prep:  Take it as told by your doctor. Starting the day before your procedure, you will need to drink a lot of liquid. The liquid will cause you to poop (have bowel movements) until your poop is almost clear or light green.  If your skin or butt gets irritated from diarrhea, you may:  Wipe the area with wipes that have medicine in them, such as adult wet wipes with aloe and vitamin E.  Put something on your skin that soothes the area, such as petroleum jelly.  If you throw up (vomit) while drinking the bowel prep, take a break for up to 60 minutes. Then begin the bowel prep again. If you keep throwing up and you cannot take the bowel prep without throwing up, call your doctor. General instructions   Ask your doctor about changing or stopping your normal medicines. This is important if you take diabetes medicines or blood thinners.  Plan to have someone take you home from the hospital or clinic. What happens during the procedure?  An IV tube may be put into one of your  veins.  You will be given medicine to help you relax (sedative).  To reduce your risk of infection:  Your doctors will wash their hands.  Your anal area will be washed with soap.  You will be asked to lie on your side with your knees bent.  Your doctor will get a long, thin, flexible tube ready. The tube will have a camera and a light on the end.  The tube will be put into your anus.  The tube will be gently put into your large intestine.  Air will be delivered into your large intestine to keep it open. You may feel some pressure or cramping.  The camera will be used to take photos.  A small tissue sample may be removed from your body to be looked at under a microscope (biopsy). If any possible problems are found, the tissue will be sent to a lab for testing.  If small growths are found, your doctor may remove them and have them checked for cancer.  The tube that was put into your anus will be slowly removed. The procedure may vary among doctors and hospitals. What happens after the procedure?  Your doctor will check on you often until the medicines you were given have worn off.  Do not drive for 24 hours after the procedure.  You may have a small amount of blood in your poop.  You may pass gas.  You may  have mild cramps or bloating in your belly (abdomen).  It is up to you to get the results of your procedure. Ask your doctor, or the department performing the procedure, when your results will be ready. This information is not intended to replace advice given to you by your health care provider. Make sure you discuss any questions you have with your health care provider. Document Released: 03/26/2010 Document Revised: 12/23/2015 Document Reviewed: 05/05/2015 Elsevier Interactive Patient Education  2017 [employer]   Decrease aspirin to 81 mg one week prior to your Colonoscopy.

## 2016-07-12 ENCOUNTER — Other Ambulatory Visit: Payer: Self-pay | Admitting: General Surgery

## 2016-07-12 DIAGNOSIS — K625 Hemorrhage of anus and rectum: Secondary | ICD-10-CM

## 2016-07-12 MED ORDER — DEXTROSE 5 % IV SOLN: 600.0000 mg | Freq: Once | INTRAVENOUS | Status: DC

## 2016-07-19 ENCOUNTER — Encounter: Payer: Self-pay | Admitting: *Deleted

## 2016-07-20 ENCOUNTER — Ambulatory Visit
Admission: RE | Admit: 2016-07-20 | Discharge: 2016-07-20 | Disposition: A | Discharge status: Home / Self Care | Payer: Medicare HMO | Source: Ambulatory Visit | Attending: General Surgery | Admitting: General Surgery

## 2016-07-20 ENCOUNTER — Ambulatory Visit: Payer: Medicare HMO | Admitting: Certified Registered"

## 2016-07-20 ENCOUNTER — Encounter: Payer: Self-pay | Admitting: *Deleted

## 2016-07-20 ENCOUNTER — Encounter
Admission: RE | Disposition: A | Discharge status: Home / Self Care | Payer: Self-pay | Source: Ambulatory Visit | Attending: General Surgery

## 2016-07-20 DIAGNOSIS — Z9889 Other specified postprocedural states: Secondary | ICD-10-CM | POA: Diagnosis not present

## 2016-07-20 DIAGNOSIS — Z90710 Acquired absence of both cervix and uterus: Secondary | ICD-10-CM | POA: Diagnosis not present

## 2016-07-20 DIAGNOSIS — Z8371 Family history of colonic polyps: Secondary | ICD-10-CM | POA: Insufficient documentation

## 2016-07-20 DIAGNOSIS — I10 Essential (primary) hypertension: Secondary | ICD-10-CM | POA: Insufficient documentation

## 2016-07-20 DIAGNOSIS — K625 Hemorrhage of anus and rectum: Secondary | ICD-10-CM

## 2016-07-20 DIAGNOSIS — K621 Rectal polyp: Secondary | ICD-10-CM | POA: Insufficient documentation

## 2016-07-20 DIAGNOSIS — D124 Benign neoplasm of descending colon: Secondary | ICD-10-CM | POA: Diagnosis not present

## 2016-07-20 DIAGNOSIS — F17210 Nicotine dependence, cigarettes, uncomplicated: Secondary | ICD-10-CM | POA: Insufficient documentation

## 2016-07-20 DIAGNOSIS — Z833 Family history of diabetes mellitus: Secondary | ICD-10-CM | POA: Diagnosis not present

## 2016-07-20 DIAGNOSIS — Z79899 Other long term (current) drug therapy: Secondary | ICD-10-CM | POA: Insufficient documentation

## 2016-07-20 DIAGNOSIS — D120 Benign neoplasm of cecum: Secondary | ICD-10-CM | POA: Diagnosis not present

## 2016-07-20 DIAGNOSIS — E785 Hyperlipidemia, unspecified: Secondary | ICD-10-CM | POA: Diagnosis not present

## 2016-07-20 DIAGNOSIS — Z8249 Family history of ischemic heart disease and other diseases of the circulatory system: Secondary | ICD-10-CM | POA: Insufficient documentation

## 2016-07-20 DIAGNOSIS — Z7982 Long term (current) use of aspirin: Secondary | ICD-10-CM | POA: Insufficient documentation

## 2016-07-20 DIAGNOSIS — K5730 Diverticulosis of large intestine without perforation or abscess without bleeding: Secondary | ICD-10-CM | POA: Insufficient documentation

## 2016-07-20 HISTORY — PX: COLONOSCOPY WITH PROPOFOL: SHX5780

## 2016-07-20 SURGERY — COLONOSCOPY WITH PROPOFOL
Anesthesia: General

## 2016-07-20 MED ORDER — SODIUM CHLORIDE 0.9 % IV SOLN
INTRAVENOUS | Status: DC
  Administered 2016-07-20: 11:00:00 via INTRAVENOUS

## 2016-07-20 MED ORDER — PROPOFOL 10 MG/ML IV BOLUS
INTRAVENOUS | Status: AC
  Filled 2016-07-20: qty 20

## 2016-07-20 MED ORDER — PHENYLEPHRINE HCL 10 MG/ML IJ SOLN
INTRAMUSCULAR | Status: DC | PRN
  Administered 2016-07-20: 200 ug via INTRAVENOUS

## 2016-07-20 MED ORDER — PROPOFOL 10 MG/ML IV BOLUS
INTRAVENOUS | Status: DC | PRN
  Administered 2016-07-20: 60 mg via INTRAVENOUS
  Administered 2016-07-20: 40 mg via INTRAVENOUS

## 2016-07-20 MED ORDER — CLINDAMYCIN PHOSPHATE 600 MG/50ML IV SOLN
600.0000 mg | Freq: Once | INTRAVENOUS | Status: AC
  Administered 2016-07-20: 600 mg via INTRAVENOUS

## 2016-07-20 MED ORDER — PROPOFOL 500 MG/50ML IV EMUL
INTRAVENOUS | Status: DC | PRN
  Administered 2016-07-20: 150 ug/kg/min via INTRAVENOUS

## 2016-07-20 NOTE — Anesthesia Post-op Follow-up Note (Cosign Needed)
Anesthesia QCDR form completed.        

## 2016-07-20 NOTE — Op Note (Signed)
[facility] Gastroenterology Patient Name: Erin Lewis Procedure Date: 07/20/2016 12:07 PM MRN: [id number] Account #: [id number] Date of Birth: [birth year] Admit Type: Outpatient Age: 61 Room: [facility] ENDO ROOM 1 Gender: Female Note Status: Finalized Procedure:            Colonoscopy Indications:          Rectal bleeding Providers:            Robert Bellow, MD Referring MD:         Mikeal Hawthorne. Brynda Greathouse MD, MD (Referring MD) Medicines:            Monitored Anesthesia Care Complications:        No immediate complications. Procedure:            Pre-Anesthesia Assessment:                       - Prior to the procedure, a History and Physical was                        performed, and patient medications, allergies and                        sensitivities were reviewed. The patient's tolerance of                        previous anesthesia was reviewed.                       - The risks and benefits of the procedure and the                        sedation options and risks were discussed with the                        patient. All questions were answered and informed                        consent was obtained.                       After obtaining informed consent, the colonoscope was                        passed under direct vision. Throughout the procedure,                        the patient's blood pressure, pulse, and oxygen                        saturations were monitored continuously. The                        Colonoscope was introduced through the anus and                        advanced to the the cecum, identified by appendiceal                        orifice and ileocecal valve. The colonoscopy was  performed without difficulty. The patient tolerated the                        procedure well. The quality of the bowel preparation                        was excellent. Findings:      Six sessile polyps were found in the rectum, proximal  descending colon       and cecum. The polyps were 5 mm in size. These were biopsied with a cold       forceps for histology.      A 10 mm polyp was found in the descending colon distal descending colon.       The polyp was pedunculated. The polyp was removed with a hot snare.       Resection and retrieval were complete.      Four sessile polyps were found in the rectum. The polyps were 7 mm in       size. These polyps were removed with a hot snare. Resection and       retrieval were complete.      A few large-mouthed diverticula were found in the ascending colon and       cecum. Impression:           - Six 5 mm polyps in the rectum, in the proximal                        descending colon and in the cecum. Biopsied.                       - One 10 mm polyp in the descending colon in the distal                        descending colon, removed with a hot snare. Resected                        and retrieved.                       - Four 7 mm polyps in the rectum, removed with a hot                        snare. Resected and retrieved.                       - Diverticulosis in the ascending colon and in the                        cecum. Recommendation:       - Telephone endoscopist for pathology results in 1 week. Procedure Code(s):    --- Professional ---                       [phone], Colonoscopy, flexible; with removal of tumor(s),                        polyp(s), or other lesion(s) by snare technique                       [id number], 59, Colonoscopy, flexible; with biopsy, single  or multiple CPT copyright 2016 American Medical Association. All rights reserved. The codes documented in this report are preliminary and upon coder review may  be revised to meet current compliance requirements. Robert Bellow, MD 07/20/2016 12:48:28 PM This report has been signed electronically. Number of Addenda: 0 Note Initiated On: 07/20/2016 12:07 PM Scope Withdrawal Time: 0 hours 23  minutes 22 seconds  Total Procedure Duration: 0 hours 28 minutes 33 seconds       [facility]

## 2016-07-20 NOTE — Anesthesia Postprocedure Evaluation (Signed)
Anesthesia Post Note  Patient: Erin Lewis  Procedure(s) Performed: Procedure(s) (LRB): COLONOSCOPY WITH PROPOFOL (N/A)  Patient location during evaluation: Endoscopy Anesthesia Type: General Level of consciousness: awake and alert Pain management: pain level controlled Vital Signs Assessment: post-procedure vital signs reviewed and stable Respiratory status: spontaneous breathing and respiratory function stable Cardiovascular status: stable Anesthetic complications: no     Last Vitals:  Vitals:   07/20/16 1247 07/20/16 1254  BP: 90/61   Pulse: 76 67  Resp: 17 18  Temp:      Last Pain:  Vitals:   07/20/16 1244  TempSrc: Tympanic                 Mendi Constable K

## 2016-07-20 NOTE — H&P (Signed)
Tolerated prep well. Single past episode of rectal bleeding. No prior colonoscopy.' Plan: Colonoscopy.

## 2016-07-20 NOTE — Transfer of Care (Signed)
Immediate Anesthesia Transfer of Care Note  Patient: Erin Lewis  Procedure(s) Performed: Procedure(s): COLONOSCOPY WITH PROPOFOL (N/A)  Patient Location: PACU  Anesthesia Type:General  Level of Consciousness: awake and responds to stimulation  Airway & Oxygen Therapy: Patient Spontanous Breathing and Patient connected to nasal cannula oxygen  Post-op Assessment: Report given to RN and Post -op Vital signs reviewed and stable  Post vital signs: Reviewed and stable  Last Vitals:  Vitals:   07/20/16 1244 07/20/16 1247  BP: 90/61 90/61  Pulse: 80 76  Resp: 19 17  Temp: (!) 36 C     Last Pain:  Vitals:   07/20/16 1244  TempSrc: Tympanic         Complications: No apparent anesthesia complications

## 2016-07-20 NOTE — Anesthesia Preprocedure Evaluation (Signed)
Anesthesia Evaluation  Patient identified by MRN, date of birth, ID band Patient awake    Reviewed: Allergy & Precautions, NPO status , Patient's Chart, lab work & pertinent test results  History of Anesthesia Complications Negative for: history of anesthetic complications  Airway Mallampati: II       Dental   Pulmonary Current Smoker,           Cardiovascular hypertension, Pt. on medications      Neuro/Psych Anxiety Depression    GI/Hepatic negative GI ROS, Neg liver ROS,   Endo/Other  negative endocrine ROS  Renal/GU negative Renal ROS     Musculoskeletal   Abdominal   Peds  Hematology negative hematology ROS (+)   Anesthesia Other Findings   Reproductive/Obstetrics                             Anesthesia Physical Anesthesia Plan  ASA: II  Anesthesia Plan:    Post-op Pain Management:    Induction: Intravenous  Airway Management Planned:   Additional Equipment:   Intra-op Plan:   Post-operative Plan:   Informed Consent: I have reviewed the patients History and Physical, chart, labs and discussed the procedure including the risks, benefits and alternatives for the proposed anesthesia with the patient or authorized representative who has indicated his/her understanding and acceptance.     Plan Discussed with:   Anesthesia Plan Comments:         Anesthesia Quick Evaluation

## 2016-07-20 NOTE — Anesthesia Procedure Notes (Signed)
Performed by: Jill Stopka Pre-anesthesia Checklist: Patient identified, Emergency Drugs available, Suction available, Patient being monitored and Timeout performed Patient Re-evaluated:Patient Re-evaluated prior to inductionOxygen Delivery Method: Nasal cannula Preoxygenation: Pre-oxygenation with 100% oxygen Intubation Type: IV induction       

## 2016-07-21 ENCOUNTER — Other Ambulatory Visit: Payer: Self-pay | Admitting: Internal Medicine

## 2016-07-21 ENCOUNTER — Encounter: Payer: Self-pay | Admitting: General Surgery

## 2016-07-21 DIAGNOSIS — Z1231 Encounter for screening mammogram for malignant neoplasm of breast: Secondary | ICD-10-CM

## 2016-07-22 LAB — SURGICAL PATHOLOGY

## 2016-07-27 ENCOUNTER — Ambulatory Visit
Admission: RE | Admit: 2016-07-27 | Discharge: 2016-07-27 | Disposition: A | Discharge status: Home / Self Care | Payer: Medicare HMO | Source: Ambulatory Visit | Attending: Internal Medicine | Admitting: Internal Medicine

## 2016-07-27 DIAGNOSIS — Z1231 Encounter for screening mammogram for malignant neoplasm of breast: Secondary | ICD-10-CM | POA: Insufficient documentation

## 2017-11-30 ENCOUNTER — Emergency Department
Admission: EM | Admit: 2017-11-30 | Discharge: 2017-11-30 | Disposition: A | Discharge status: Home / Self Care | Payer: Medicare HMO | Attending: Emergency Medicine | Admitting: Emergency Medicine

## 2017-11-30 ENCOUNTER — Encounter: Payer: Self-pay | Admitting: Emergency Medicine

## 2017-11-30 ENCOUNTER — Other Ambulatory Visit: Payer: Self-pay

## 2017-11-30 DIAGNOSIS — Y999 Unspecified external cause status: Secondary | ICD-10-CM | POA: Diagnosis not present

## 2017-11-30 DIAGNOSIS — S81811A Laceration without foreign body, right lower leg, initial encounter: Secondary | ICD-10-CM | POA: Insufficient documentation

## 2017-11-30 DIAGNOSIS — Y929 Unspecified place or not applicable: Secondary | ICD-10-CM | POA: Diagnosis not present

## 2017-11-30 DIAGNOSIS — Y939 Activity, unspecified: Secondary | ICD-10-CM | POA: Insufficient documentation

## 2017-11-30 DIAGNOSIS — Z5321 Procedure and treatment not carried out due to patient leaving prior to being seen by health care provider: Secondary | ICD-10-CM | POA: Diagnosis not present
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
## 2017-12-01 ENCOUNTER — Encounter: Payer: Self-pay | Admitting: *Deleted

## 2017-12-01 ENCOUNTER — Other Ambulatory Visit: Payer: Self-pay

## 2017-12-01 ENCOUNTER — Emergency Department
Admission: EM | Admit: 2017-12-01 | Discharge: 2017-12-01 | Disposition: A | Discharge status: Home / Self Care | Payer: Medicare HMO | Attending: Emergency Medicine | Admitting: Emergency Medicine

## 2017-12-01 DIAGNOSIS — Z7982 Long term (current) use of aspirin: Secondary | ICD-10-CM | POA: Diagnosis not present

## 2017-12-01 DIAGNOSIS — I10 Essential (primary) hypertension: Secondary | ICD-10-CM | POA: Insufficient documentation

## 2017-12-01 DIAGNOSIS — X58XXXA Exposure to other specified factors, initial encounter: Secondary | ICD-10-CM | POA: Insufficient documentation

## 2017-12-01 DIAGNOSIS — Y999 Unspecified external cause status: Secondary | ICD-10-CM | POA: Insufficient documentation

## 2017-12-01 DIAGNOSIS — F17210 Nicotine dependence, cigarettes, uncomplicated: Secondary | ICD-10-CM | POA: Diagnosis not present

## 2017-12-01 DIAGNOSIS — Z79899 Other long term (current) drug therapy: Secondary | ICD-10-CM | POA: Insufficient documentation

## 2017-12-01 DIAGNOSIS — Z23 Encounter for immunization: Secondary | ICD-10-CM | POA: Insufficient documentation

## 2017-12-01 DIAGNOSIS — S81811A Laceration without foreign body, right lower leg, initial encounter: Secondary | ICD-10-CM

## 2017-12-01 DIAGNOSIS — Y939 Activity, unspecified: Secondary | ICD-10-CM | POA: Insufficient documentation

## 2017-12-01 DIAGNOSIS — Y929 Unspecified place or not applicable: Secondary | ICD-10-CM | POA: Diagnosis not present

## 2017-12-01 DIAGNOSIS — S8991XA Unspecified injury of right lower leg, initial encounter: Secondary | ICD-10-CM | POA: Diagnosis present

## 2017-12-01 MED ORDER — CLINDAMYCIN HCL 300 MG PO CAPS: 300.0000 mg | ORAL_CAPSULE | Freq: Four times a day (QID) | ORAL | 0 refills | Status: AC

## 2017-12-01 MED ORDER — TRAMADOL HCL 50 MG PO TABS: 50.0000 mg | ORAL_TABLET | Freq: Four times a day (QID) | ORAL | 0 refills | Status: DC | PRN

## 2017-12-01 MED ORDER — CLINDAMYCIN PHOSPHATE 300 MG/2ML IJ SOLN
600.0000 mg | Freq: Once | INTRAMUSCULAR | Status: AC
  Administered 2017-12-01: 600 mg via INTRAMUSCULAR
  Filled 2017-12-01: qty 4

## 2017-12-01 MED ORDER — LIDOCAINE HCL (PF) 1 % IJ SOLN
5.0000 mL | Freq: Once | INTRAMUSCULAR | Status: AC
  Administered 2017-12-01: 5 mL via INTRADERMAL
  Filled 2017-12-01: qty 5

## 2017-12-01 MED ORDER — TETANUS-DIPHTH-ACELL PERTUSSIS 5-2.5-18.5 LF-MCG/0.5 IM SUSP
0.5000 mL | Freq: Once | INTRAMUSCULAR | Status: AC
  Administered 2017-12-01: 0.5 mL via INTRAMUSCULAR
  Filled 2017-12-01: qty 0.5

## 2017-12-01 MED ORDER — BACITRACIN-NEOMYCIN-POLYMYXIN 400-5-5000 EX OINT
TOPICAL_OINTMENT | Freq: Once | CUTANEOUS | Status: AC
  Administered 2017-12-01: 18:00:00 via TOPICAL
  Filled 2017-12-01: qty 1

## 2017-12-01 NOTE — ED Provider Notes (Signed)
[facility] Emergency Department Provider Note  ____________________________________________  Time seen: Approximately 5:08 PM  I have reviewed the triage vital signs and the nursing notes.   HISTORY  Chief Complaint Laceration    HPI Erin Lewis is a 62 y.o. female that presents emergency department for evaluation of right shin laceration that happened yesterday about 3-4pm.  She is unsure what she cut her shin on because she was drinking.  She came to the emergency department last night but left due to the wait.  It has been draining blood and clear fluid throughout the night.  Patient is unsure of last tetanus shot.  No fever, chills.  Past Medical History:  Diagnosis Date  . Hyperlipidemia   . Hypertension     Patient Active Problem List   Diagnosis Date Noted  . Rectal bleeding 07/11/2016  . Sensory deficit, left 03/11/2016  . HTN (hypertension) 03/11/2016  . Expressive aphasia 03/11/2016  . Weakness of left lower extremity 03/11/2016  . Major depressive disorder, recurrent, severe without psychotic features ([place]) 10/07/2014  . Anxiety reaction 10/07/2014    Past Surgical History:  Procedure Laterality Date  . [facility]  . CESAREAN SECTION     x2  . COLONOSCOPY WITH PROPOFOL N/A 07/20/2016   Procedure: COLONOSCOPY WITH PROPOFOL;  Surgeon: Robert Bellow, MD;  Location: [facility] ENDOSCOPY;  Service: Endoscopy;  Laterality: N/A;  . laproscopy     abdomen  . SHOULDER ARTHROSCOPY W/ ROTATOR CUFF REPAIR     x2  . TOTAL SHOULDER ARTHROPLASTY Right 6-7 years ago    Prior to Admission medications   Medication Sig Start Date End Date Taking? Authorizing Provider  aspirin EC 81 MG tablet Take 81 mg by mouth daily.    [provider]  atorvastatin (LIPITOR) 20 MG tablet Take 20 mg by mouth daily.    [provider]  cetirizine (ZYRTEC) 10 MG tablet Take 10 mg by mouth. 07/05/16 07/05/17  [provider]  clindamycin (CLEOCIN) 300 MG capsule Take 1 capsule (300 mg total) by mouth 4 (four) times daily for 10 days. 12/01/17 12/11/17  Laban Emperor, PA-C  ergocalciferol (VITAMIN D2) 50000 units capsule Take 50,000 Units by mouth once a week.    [provider]  losartan-hydrochlorothiazide (HYZAAR) 100-25 MG tablet Take 1 tablet by mouth daily.    [provider]  traMADol (ULTRAM) 50 MG tablet Take 1 tablet (50 mg total) by mouth every 6 (six) hours as needed. 12/01/17 12/01/18  Laban Emperor, PA-C    Allergies Patient has no known allergies.  Family History  Problem Relation Age of Onset  . Hypertension Other   . Diabetes Other   . Colon polyps Father 4  . Breast cancer Neg Hx     Social History Social History   Tobacco Use  . Smoking status: Current Every Day Smoker    Packs/day: 0.50    Types: Cigarettes  . Smokeless tobacco: Never Used  Substance Use Topics  . Alcohol use: Yes    Frequency: Never    Comment: occasionally  . Drug use: No     Review of Systems  Constitutional: No fever/chills Gastrointestinal:  No nausea, no vomiting.  Musculoskeletal: Negative for musculoskeletal pain. Skin: Negative for rash, ecchymosis.  Positive for laceration. Neurological: Negative for numbness or tingling   ____________________________________________   PHYSICAL EXAM:  VITAL SIGNS: ED Triage Vitals  Enc Vitals Group     BP  12/01/17 1626 110/68     Pulse Rate 12/01/17 1626 90     Resp 12/01/17 1626 16     Temp 12/01/17 1626 98.1 F (36.7 C)     Temp Source 12/01/17 1626 Oral     SpO2 12/01/17 1626 100 %     Weight 12/01/17 1623 165 lb (74.8 kg)     Height 12/01/17 1623 5' 5.5" (1.664 m)     Head Circumference --      Peak Flow --      Pain Score 12/01/17 1623 6     Pain Loc --      Pain Edu? --      Excl. in [place]? --      Constitutional: Alert and oriented. Well appearing and in no acute distress. Eyes: Conjunctivae are normal.  PERRL. EOMI. Head: Atraumatic. ENT:      Ears:      Nose: No congestion/rhinnorhea.      Mouth/Throat: Mucous membranes are moist.  Neck: No stridor.  Cardiovascular: Good peripheral circulation. Respiratory:Good air entry to the bases with no decreased or absent breath sounds. Musculoskeletal: Full range of motion to all extremities. No gross deformities appreciated. Neurologic:  Normal speech and language. No gross focal neurologic deficits are appreciated.  Skin:  Skin is warm, dry.  3 cm L shaped deep laceration to right shin. Clean. Abrasion to right shin.  No surrounding erythema.  Clear drainage from wounds. Psychiatric: Mood and affect are normal. Speech and behavior are normal. Patient exhibits appropriate insight and judgement.   ____________________________________________   LABS (all labs ordered are listed, but only abnormal results are displayed)  Labs Reviewed - No data to display ____________________________________________  EKG   ____________________________________________  RADIOLOGY   No results found.  ____________________________________________    PROCEDURES  Procedure(s) performed:    Procedures  LACERATION REPAIR Performed by: Laban Emperor  Consent: Verbal consent obtained.  Consent given by: patient  Prepped and Draped in normal sterile fashion  Wound explored: No foreign bodies   Laceration Location: right shin  Laceration Length: 3 cm  Anesthesia: None  Local anesthetic: lidocaine 1% without epinephrine  Anesthetic total: 4 ml  Irrigation method: syringe  Amount of cleaning: 553ml normal saline  Skin closure: 4-0 nylon  Number of sutures: 6  Technique: Simple interrupted  Patient tolerance: Patient tolerated the procedure well with no immediate complications.  Medications  neomycin-bacitracin-polymyxin (NEOSPORIN) ointment (has no administration in time range)  lidocaine (PF) (XYLOCAINE) 1 % injection 5 mL (5  mLs Intradermal Given 12/01/17 1754)  clindamycin (CLEOCIN) injection 600 mg (600 mg Intramuscular Given 12/01/17 1746)  Tdap (BOOSTRIX) injection 0.5 mL (0.5 mLs Intramuscular Given 12/01/17 1744)     ____________________________________________   INITIAL IMPRESSION / ASSESSMENT AND PLAN / ED COURSE  Pertinent labs & imaging results that were available during my care of the patient were reviewed by me and considered in my medical decision making (see chart for details).  Review of the [place] CSRS was performed in accordance of the [place] prior to dispensing any controlled drugs.     Patient's diagnosis is consistent with shin laceration.  Vital signs and exam are reassuring.  Wound is 24 hours old and is gaping and ope.Marland Kitchen  Options were discussed with patient including leaving wound open for secondary granulation or closing wound with risk of infection.  Risks and benefits of each were discussed.  Wound was closed with simple interrupted stitches given how large and deep wound was.  Wound is clean.  Tetanus shot was updated.  IM clindamycin was given.  Patient will be discharged home with prescriptions for clindamycin, tramadol. Patient is to follow up with primary care as directed. Patient is given ED precautions to return to the ED for any worsening or new symptoms.     ____________________________________________  FINAL CLINICAL IMPRESSION(S) / ED DIAGNOSES  Final diagnoses:  Laceration of right lower extremity, initial encounter      NEW MEDICATIONS STARTED DURING THIS VISIT:  ED Discharge Orders         Ordered    clindamycin (CLEOCIN) 300 MG capsule  4 times daily     12/01/17 1809    traMADol (ULTRAM) 50 MG tablet  Every 6 hours PRN     12/01/17 1809              This chart was dictated using voice recognition software/Dragon. Despite best efforts to proofread, errors can occur which can change the meaning. Any change was purely unintentional.    Laban Emperor,  PA-C 12/01/17 [place], Randall An, MD 12/04/17 [phone]

## 2017-12-01 NOTE — ED Notes (Signed)
Pt reports that she has laceration to right shin - she reports that the laceration occurred at 330pm - pt reports that she fell and hit leg on "something"

## 2017-12-01 NOTE — ED Triage Notes (Signed)
Laceration to right shin after a mechanical fall yesterday. Bleeding under control. Pt able to bear weight on right leg without difficulty.

## 2017-12-15 ENCOUNTER — Emergency Department
Admission: EM | Admit: 2017-12-15 | Discharge: 2017-12-15 | Disposition: A | Discharge status: Home / Self Care | Payer: Medicare HMO | Attending: Emergency Medicine | Admitting: Emergency Medicine

## 2017-12-15 ENCOUNTER — Encounter: Payer: Self-pay | Admitting: Medical Oncology

## 2017-12-15 DIAGNOSIS — Z79899 Other long term (current) drug therapy: Secondary | ICD-10-CM | POA: Diagnosis not present

## 2017-12-15 DIAGNOSIS — I10 Essential (primary) hypertension: Secondary | ICD-10-CM | POA: Insufficient documentation

## 2017-12-15 DIAGNOSIS — T148XXA Other injury of unspecified body region, initial encounter: Secondary | ICD-10-CM

## 2017-12-15 DIAGNOSIS — L089 Local infection of the skin and subcutaneous tissue, unspecified: Secondary | ICD-10-CM | POA: Diagnosis present

## 2017-12-15 DIAGNOSIS — Y69 Unspecified misadventure during surgical and medical care: Secondary | ICD-10-CM | POA: Diagnosis not present

## 2017-12-15 DIAGNOSIS — T8133XA Disruption of traumatic injury wound repair, initial encounter: Secondary | ICD-10-CM | POA: Insufficient documentation

## 2017-12-15 DIAGNOSIS — Z7982 Long term (current) use of aspirin: Secondary | ICD-10-CM | POA: Diagnosis not present

## 2017-12-15 DIAGNOSIS — F17210 Nicotine dependence, cigarettes, uncomplicated: Secondary | ICD-10-CM | POA: Insufficient documentation

## 2017-12-15 DIAGNOSIS — T8130XA Disruption of wound, unspecified, initial encounter: Secondary | ICD-10-CM

## 2017-12-15 MED ORDER — CEPHALEXIN 500 MG PO CAPS: 500.0000 mg | ORAL_CAPSULE | Freq: Four times a day (QID) | ORAL | 0 refills | Status: DC

## 2017-12-15 MED ORDER — SULFAMETHOXAZOLE-TRIMETHOPRIM 800-160 MG PO TABS: 1.0000 | ORAL_TABLET | Freq: Two times a day (BID) | ORAL | 0 refills | Status: DC

## 2017-12-15 NOTE — ED Provider Notes (Signed)
[facility] Emergency Department Provider Note  ____________________________________________  Time seen: Approximately 1:22 PM  I have reviewed the triage vital signs and the nursing notes.   HISTORY  Chief Complaint Laceration    HPI Erin Lewis is a 62 y.o. female who presents the emergency department complaining of wound to the right anterior shin.  Patient reports that she had injured herself approximately 2 weeks prior, was seen in this department and sutures placed for a wound to the right shin.  Patient reports that she was placed on antibiotics but did not finish the same.  She followed up in a week with her primary care who removed sutures without complication.  After suture removal, wound dehisced, and patient has had some mild drainage to the area.  She denies any significant surrounding erythema or edema.  She denies any significant pain to the area.  Patient is concerned only given the fact that area opened up.  Patient denies any systemic complaints of fevers or chills, nausea or vomiting.  No medications currently for this complaint.  Again, patient did not finish antibiotics prior to suture removal.  Patient was initially on clindamycin    Past Medical History:  Diagnosis Date  . Hyperlipidemia   . Hypertension     Patient Active Problem List   Diagnosis Date Noted  . Rectal bleeding 07/11/2016  . Sensory deficit, left 03/11/2016  . HTN (hypertension) 03/11/2016  . Expressive aphasia 03/11/2016  . Weakness of left lower extremity 03/11/2016  . Major depressive disorder, recurrent, severe without psychotic features ([place]) 10/07/2014  . Anxiety reaction 10/07/2014    Past Surgical History:  Procedure Laterality Date  . [facility]  . CESAREAN SECTION     x2  . COLONOSCOPY WITH PROPOFOL N/A 07/20/2016   Procedure: COLONOSCOPY WITH PROPOFOL;  Surgeon: Robert Bellow, MD;  Location: [facility] ENDOSCOPY;   Service: Endoscopy;  Laterality: N/A;  . laproscopy     abdomen  . SHOULDER ARTHROSCOPY W/ ROTATOR CUFF REPAIR     x2  . TOTAL SHOULDER ARTHROPLASTY Right 6-7 years ago    Prior to Admission medications   Medication Sig Start Date End Date Taking? Authorizing Provider  aspirin EC 81 MG tablet Take 81 mg by mouth daily.    [provider]  atorvastatin (LIPITOR) 20 MG tablet Take 20 mg by mouth daily.    [provider]  cephALEXin (KEFLEX) 500 MG capsule Take 1 capsule (500 mg total) by mouth 4 (four) times daily. 12/15/17   Daman Steffenhagen, Charline Bills, PA-C  cetirizine (ZYRTEC) 10 MG tablet Take 10 mg by mouth. 07/05/16 07/05/17  [provider]  ergocalciferol (VITAMIN D2) 50000 units capsule Take 50,000 Units by mouth once a week.    [provider]  losartan-hydrochlorothiazide (HYZAAR) 100-25 MG tablet Take 1 tablet by mouth daily.    [provider]  sulfamethoxazole-trimethoprim (BACTRIM DS,SEPTRA DS) 800-160 MG tablet Take 1 tablet by mouth 2 (two) times daily. 12/15/17   Savana Spina, Charline Bills, PA-C  traMADol (ULTRAM) 50 MG tablet Take 1 tablet (50 mg total) by mouth every 6 (six) hours as needed. 12/01/17 12/01/18  Laban Emperor, PA-C    Allergies Patient has no known allergies.  Family History  Problem Relation Age of Onset  . Hypertension Other   . Diabetes Other   . Colon polyps Father 74  . Breast cancer Neg Hx     Social History Social History  Tobacco Use  . Smoking status: Current Every Day Smoker    Packs/day: 0.50    Types: Cigarettes  . Smokeless tobacco: Never Used  Substance Use Topics  . Alcohol use: Yes    Frequency: Never    Comment: occasionally  . Drug use: No     Review of Systems  Constitutional: No fever/chills Eyes: No visual changes.  Cardiovascular: no chest pain. Respiratory: no cough. No SOB. Gastrointestinal: No abdominal pain.  No nausea, no vomiting.  Musculoskeletal: Negative for  musculoskeletal pain. Skin: Previously sutured wound to the right anterior shin that has dehisced after suture removal. Neurological: Negative for headaches, focal weakness or numbness. 10-point ROS otherwise negative.  ____________________________________________   PHYSICAL EXAM:  VITAL SIGNS: ED Triage Vitals  Enc Vitals Group     BP 12/15/17 1302 96/63     Pulse Rate 12/15/17 1302 87     Resp 12/15/17 1302 17     Temp 12/15/17 1302 98.3 F (36.8 C)     Temp Source 12/15/17 1302 Oral     SpO2 12/15/17 1302 98 %     Weight 12/15/17 1303 163 lb 2.3 oz (74 kg)     Height 12/15/17 1303 5\' 5"  (1.651 m)     Head Circumference --      Peak Flow --      Pain Score 12/15/17 1303 0     Pain Loc --      Pain Edu? --      Excl. in [place]? --      Constitutional: Alert and oriented. Well appearing and in no acute distress. Eyes: Conjunctivae are normal. PERRL. EOMI. Head: Atraumatic. Neck: No stridor.   Cardiovascular: Normal rate, regular rhythm. Normal S1 and S2.  Good peripheral circulation. Respiratory: Normal respiratory effort without tachypnea or retractions. Lungs CTAB. Good air entry to the bases with no decreased or absent breath sounds. Musculoskeletal: Full range of motion to all extremities. No gross deformities appreciated. Neurologic:  Normal speech and language. No gross focal neurologic deficits are appreciated.  Skin:  Skin is warm, dry and intact. No rash noted.  Visualization of the right anterior Lower leg reveals wound consistent with a dehisced laceration.  No gross surrounding erythema or edema.  Area measures approximately 4 cm in length.  In the center of wound, mild erythema, mild serous/purulent drainage.  Palpation of the area reveals no fluctuance.  No induration.  Area is nontender to palpation.  Palpation does not express any drainage.  Patient also with 2 scabbed lesions proximal to the hist wound.  No surrounding erythema or edema to these lesions.     Psychiatric: Mood and affect are normal. Speech and behavior are normal. Patient exhibits appropriate insight and judgement.     ____________________________________________   LABS (all labs ordered are listed, but only abnormal results are displayed)  Labs Reviewed - No data to display ____________________________________________  EKG   ____________________________________________  RADIOLOGY   No results found.  ____________________________________________    PROCEDURES  Procedure(s) performed:    Procedures    Medications - No data to display   ____________________________________________   INITIAL IMPRESSION / ASSESSMENT AND PLAN / ED COURSE  Pertinent labs & imaging results that were available during my care of the patient were reviewed by me and considered in my medical decision making (see chart for details).  Review of the [place] CSRS was performed in accordance of the [place] prior to dispensing any controlled drugs.  Patient's diagnosis is consistent with test wound with mild cellulitic changes.  Patient presents emergency department with wound to the right anterior shin.  Patient had a sutured laceration to this area after a fall.  Patient had been placed on antibiotics prophylactically but did not finish antibiotics.  Patient presented to her primary care after a week, sutures were removed with no complications.  Several days later, wound dehisced.  Patient has had mild drainage from the area.  On exam, no deep underlying indication of abscess or infection warranting labs or imaging.  Given the nature of wound, no re-attempts at closure.  Patient's wound is covered, patient will be prescribed antibiotic for same.  As patient did not finish her original prescription of clindamycin, antibiotics will be changed to Keflex and Bactrim.  Follow-up with primary care if needed. Patient is given ED precautions to return to the ED for any worsening or new  symptoms.     ____________________________________________  FINAL CLINICAL IMPRESSION(S) / ED DIAGNOSES  Final diagnoses:  Wound dehiscence  Wound infection      NEW MEDICATIONS STARTED DURING THIS VISIT:  ED Discharge Orders         Ordered    sulfamethoxazole-trimethoprim (BACTRIM DS,SEPTRA DS) 800-160 MG tablet  2 times daily     12/15/17 1338    cephALEXin (KEFLEX) 500 MG capsule  4 times daily     12/15/17 1338              This chart was dictated using voice recognition software/Dragon. Despite best efforts to proofread, errors can occur which can change the meaning. Any change was purely unintentional.    Darletta Moll, PA-C 12/15/17 1351    Earleen Newport, MD 12/15/17 [phone]

## 2017-12-15 NOTE — ED Triage Notes (Signed)
Pt was seen here on 9/27 for sutures, went to PCP Wednesday for removal. Today area opened back up. No bleeding noted.

## 2017-12-27 ENCOUNTER — Ambulatory Visit: Payer: Self-pay | Admitting: Internal Medicine

## 2018-05-17 ENCOUNTER — Other Ambulatory Visit: Payer: Self-pay | Admitting: Internal Medicine

## 2018-05-17 DIAGNOSIS — Z1231 Encounter for screening mammogram for malignant neoplasm of breast: Secondary | ICD-10-CM

## 2018-05-19 ENCOUNTER — Emergency Department: Payer: Medicare HMO

## 2018-05-19 ENCOUNTER — Other Ambulatory Visit: Payer: Self-pay

## 2018-05-19 ENCOUNTER — Emergency Department
Admission: EM | Admit: 2018-05-19 | Discharge: 2018-05-20 | Disposition: A | Discharge status: Other Acute Inpatient Hospital | Payer: Medicare HMO | Attending: Emergency Medicine | Admitting: Emergency Medicine

## 2018-05-19 DIAGNOSIS — Z7982 Long term (current) use of aspirin: Secondary | ICD-10-CM | POA: Diagnosis not present

## 2018-05-19 DIAGNOSIS — F329 Major depressive disorder, single episode, unspecified: Secondary | ICD-10-CM | POA: Diagnosis not present

## 2018-05-19 DIAGNOSIS — F1010 Alcohol abuse, uncomplicated: Secondary | ICD-10-CM | POA: Insufficient documentation

## 2018-05-19 DIAGNOSIS — F1020 Alcohol dependence, uncomplicated: Secondary | ICD-10-CM | POA: Diagnosis present

## 2018-05-19 DIAGNOSIS — Z79899 Other long term (current) drug therapy: Secondary | ICD-10-CM | POA: Insufficient documentation

## 2018-05-19 DIAGNOSIS — Z915 Personal history of self-harm: Secondary | ICD-10-CM | POA: Diagnosis not present

## 2018-05-19 DIAGNOSIS — I10 Essential (primary) hypertension: Secondary | ICD-10-CM | POA: Insufficient documentation

## 2018-05-19 DIAGNOSIS — Z87891 Personal history of nicotine dependence: Secondary | ICD-10-CM | POA: Insufficient documentation

## 2018-05-19 DIAGNOSIS — T50904A Poisoning by unspecified drugs, medicaments and biological substances, undetermined, initial encounter: Secondary | ICD-10-CM | POA: Diagnosis present

## 2018-05-19 DIAGNOSIS — F17210 Nicotine dependence, cigarettes, uncomplicated: Secondary | ICD-10-CM | POA: Insufficient documentation

## 2018-05-19 DIAGNOSIS — F411 Generalized anxiety disorder: Secondary | ICD-10-CM | POA: Diagnosis present

## 2018-05-19 DIAGNOSIS — F332 Major depressive disorder, recurrent severe without psychotic features: Secondary | ICD-10-CM | POA: Diagnosis present

## 2018-05-19 LAB — COMPREHENSIVE METABOLIC PANEL
ALK PHOS: 69 U/L (ref 38–126)
ALT: 18 U/L (ref 0–44)
AST: 33 U/L (ref 15–41)
Albumin: 4.7 g/dL (ref 3.5–5.0)
Anion gap: 12 (ref 5–15)
BILIRUBIN TOTAL: 1.5 mg/dL — AB (ref 0.3–1.2)
BUN: 24 mg/dL — AB (ref 8–23)
CALCIUM: 9.4 mg/dL (ref 8.9–10.3)
CO2: 22 mmol/L (ref 22–32)
CREATININE: 1.1 mg/dL — AB (ref 0.44–1.00)
Chloride: 100 mmol/L (ref 98–111)
GFR, EST NON AFRICAN AMERICAN: 54 mL/min — AB (ref >60)
Glucose, Bld: 60 mg/dL — ABNORMAL LOW (ref 70–99)
Potassium: 4.2 mmol/L (ref 3.5–5.1)
Sodium: 134 mmol/L — ABNORMAL LOW (ref 135–145)
Total Protein: 8.4 g/dL — ABNORMAL HIGH (ref 6.5–8.1)

## 2018-05-19 LAB — CBC
HEMATOCRIT: 43.6 % (ref 36.0–46.0)
HEMOGLOBIN: 14.6 g/dL (ref 12.0–15.0)
MCH: 30.9 pg (ref 26.0–34.0)
MCHC: 33.5 g/dL (ref 30.0–36.0)
MCV: 92.4 fL (ref 80.0–100.0)
Platelets: 327 10*3/uL (ref 150–400)
RBC: 4.72 MIL/uL (ref 3.87–5.11)
RDW: 14.3 % (ref 11.5–15.5)
WBC: 8.7 10*3/uL (ref 4.0–10.5)
nRBC: 0 % (ref 0.0–0.2)

## 2018-05-19 LAB — SALICYLATE LEVEL

## 2018-05-19 LAB — ACETAMINOPHEN LEVEL

## 2018-05-19 MED ORDER — HALOPERIDOL LACTATE 5 MG/ML IJ SOLN: 5.0000 mg | Freq: Once | INTRAMUSCULAR | Status: DC

## 2018-05-19 MED ORDER — LORAZEPAM 2 MG/ML IJ SOLN: 2.0000 mg | Freq: Once | INTRAMUSCULAR | Status: DC

## 2018-05-19 NOTE — ED Notes (Signed)
Meal tray and po fluids provided 

## 2018-05-19 NOTE — ED Notes (Signed)
Pt still unwilling to allow staff to perform EKG, or collect UA. Asked if pt was ready to provide urine sample. Pt states, "that was over an hour ago." Explained that pt had thrown hat away so sample could not be collected. Pt turned back on nurse and refused to speak further.

## 2018-05-19 NOTE — ED Notes (Signed)
Pt dressed out. Belongings include: gray sweater, black shirt, tan bra, underwear.

## 2018-05-19 NOTE — ED Notes (Signed)
Report to [place], rn 

## 2018-05-19 NOTE — ED Notes (Signed)
ED Provider at bedside. 

## 2018-05-19 NOTE — ED Notes (Signed)
Report from [place], [place].

## 2018-05-19 NOTE — ED Notes (Signed)
Patient is transferred from [place] to [facility].Patient is tearful and not happy being here states "I did not do anything".Refused dinner.Patient made aware of 15 min checks and camera monitoring.Support and encouragement given.

## 2018-05-19 NOTE — ED Notes (Signed)
Pt continues to refused to allow VS recheck or lab work to be collected. EMS provided bottles to RN: 1 bottle of losartan/HCTZ 100-25mg  with 3 half tabs (#90 filled 09/28/17), 1 bottle hydroxyzine 25mg  with 4.5 tabs (#90 filled 11/16/17), and 1 bottle clonazepam 0.5mg  empty (#30 filled 04/05/18).

## 2018-05-19 NOTE — ED Notes (Signed)
Report to include situation, background, assessment and recommendations from April RN. Patient sleeping, respirations regular and unlabored. Q15 minute rounds and security camera observation to continue.   

## 2018-05-19 NOTE — ED Notes (Signed)
"  i did not take 15 pills. yes i have been drinking. i took my normal medication"  pt keeps eyes closed while talking to staff except when she raises her voice toward [employer] and EMS  "you just ran into a stubborn black bitch. stupid mother fucker"

## 2018-05-19 NOTE — ED Notes (Signed)
Pt refusing EKG and urine test but allowed blood work to be drawn.

## 2018-05-19 NOTE — ED Notes (Signed)
Hourly rounding reveals patient in room. No complaints, stable, in no acute distress. Q15 minute rounds and monitoring via Security Cameras to continue. 

## 2018-05-19 NOTE — ED Triage Notes (Signed)
[facility] emergency traffic d/t raising fist at EMS  pt arrives with [employer]. [employer] states pt called son telling him that pt took 15 valium. [employer] states found pt on floor. took 2 minutes to get pt to talk and open eyes. poss ETOH. pt states "i'm allowed to drink in my own house."  "took 15 valium"  pt denies taking pills no valium found per EMS empty losartan and HCTZ, hydralazine, klonopin poison control- agression and combative could be anticholinergic effects from losartan  became violent toward EMS arrives in handcuffs raising voice at staff upon arrival "everything he's saying is a lie" referring to EMS and [employer]   Pt states "I have to pee." hat placed in toilet. Pt ambulated to toilet, took hat out of toilet and threw it on floor. Continued to urinate into toilet.

## 2018-05-19 NOTE — ED Notes (Signed)
Pt refusing all care, refusing to give urine, refusing to have blood drawn. States "I want to be transferred to [facility] because y'all do shotty work." pt informed she has to be stabilized and be seen by the ER doc before anything can happen.

## 2018-05-19 NOTE — ED Provider Notes (Signed)
[facility] Emergency Department Provider Note  ____________________________________________  Time seen: Approximately 5:45 PM  I have reviewed the triage vital signs and the nursing notes.   HISTORY  Chief Complaint Drug Overdose and Alcohol Problem    Level 5 Caveat: Portions of the History and Physical including HPI and review of systems are unable to be completely obtained due to patient being a poor historian and evasive  HPI Erin Lewis is a 63 y.o. female with a history of hypertension hyperlipidemia brought to the ED under involuntary commitment due to  suspected overdose.  Patient's been drinking at home, and son found her with empty pill bottles for losartan hydrochlorothiazide hydralazine and Klonopin at home.  Patient had reported to her son that she took 15 Valium.  To me patient denies any symptoms states she feels fine and wants to go home.    She has a clear agenda of minimizing her complaints and presenting herself as fine with a goal of being discharged home soon as possible.     Past Medical History:  Diagnosis Date  . Hyperlipidemia   . Hypertension      Patient Active Problem List   Diagnosis Date Noted  . Rectal bleeding 07/11/2016  . Sensory deficit, left 03/11/2016  . HTN (hypertension) 03/11/2016  . Expressive aphasia 03/11/2016  . Weakness of left lower extremity 03/11/2016  . Major depressive disorder, recurrent, severe without psychotic features ([place]) 10/07/2014  . Anxiety reaction 10/07/2014     Past Surgical History:  Procedure Laterality Date  . [facility]  . CESAREAN SECTION     x2  . COLONOSCOPY WITH PROPOFOL N/A 07/20/2016   Procedure: COLONOSCOPY WITH PROPOFOL;  Surgeon: Robert Bellow, MD;  Location: [facility] ENDOSCOPY;  Service: Endoscopy;  Laterality: N/A;  . laproscopy     abdomen  . SHOULDER ARTHROSCOPY W/ ROTATOR CUFF REPAIR     x2  . TOTAL SHOULDER ARTHROPLASTY  Right 6-7 years ago     Prior to Admission medications   Medication Sig Start Date End Date Taking? Authorizing Provider  aspirin EC 81 MG tablet Take 81 mg by mouth daily.    [provider]  atorvastatin (LIPITOR) 20 MG tablet Take 20 mg by mouth daily.    [provider]  cephALEXin (KEFLEX) 500 MG capsule Take 1 capsule (500 mg total) by mouth 4 (four) times daily. 12/15/17   Cuthriell, Charline Bills, PA-C  cetirizine (ZYRTEC) 10 MG tablet Take 10 mg by mouth. 07/05/16 07/05/17  [provider]  ergocalciferol (VITAMIN D2) 50000 units capsule Take 50,000 Units by mouth once a week.    [provider]  losartan-hydrochlorothiazide (HYZAAR) 100-25 MG tablet Take 1 tablet by mouth daily.    [provider]  sulfamethoxazole-trimethoprim (BACTRIM DS,SEPTRA DS) 800-160 MG tablet Take 1 tablet by mouth 2 (two) times daily. 12/15/17   Cuthriell, Charline Bills, PA-C  traMADol (ULTRAM) 50 MG tablet Take 1 tablet (50 mg total) by mouth every 6 (six) hours as needed. 12/01/17 12/01/18  Laban Emperor, PA-C     Allergies Patient has no known allergies.   Family History  Problem Relation Age of Onset  . Hypertension Other   . Diabetes Other   . Colon polyps Father 37  . Breast cancer Neg Hx     Social History Social History   Tobacco Use  . Smoking status: Current Every Day Smoker    Packs/day: 0.50  Types: Cigarettes  . Smokeless tobacco: Never Used  Substance Use Topics  . Alcohol use: Yes    Frequency: Never    Comment: occasionally  . Drug use: No    Review of Systems Level 5 Caveat: Portions of the History and Physical including HPI and review of systems are unable to be completely obtained due to patient being a poor historian   Constitutional:   No known fever.  ENT:   No rhinorrhea. Cardiovascular:   No chest pain or syncope. Respiratory:   No dyspnea or cough. Gastrointestinal:   Negative for abdominal pain, vomiting and diarrhea.   Musculoskeletal:   Negative for focal pain or swelling ____________________________________________   PHYSICAL EXAM:  VITAL SIGNS: ED Triage Vitals  Enc Vitals Group     BP 05/19/18 1438 (!) 151/91     Pulse Rate 05/19/18 1438 80     Resp 05/19/18 1516 16     Temp 05/19/18 1438 97.9 F (36.6 C)     Temp src --      SpO2 05/19/18 1438 98 %     Weight 05/19/18 1438 150 lb (68 kg)     Height 05/19/18 1438 5\' 5"  (1.651 m)     Head Circumference --      Peak Flow --      Pain Score 05/19/18 1438 0     Pain Loc --      Pain Edu? --      Excl. in [place]? --     Vital signs reviewed, nursing assessments reviewed. Blood pressure 110/80 on my exam  Constitutional:   Alert and oriented. Non-toxic appearance. Eyes:   Conjunctivae are normal. EOMI. PERRL.  No nystagmus ENT      Head:   Normocephalic and atraumatic.      Nose:   No congestion/rhinnorhea.       Mouth/Throat:   MMM, no pharyngeal erythema. No peritonsillar mass.       Neck:   No meningismus. Full ROM. Hematological/Lymphatic/Immunilogical:   No cervical lymphadenopathy. Cardiovascular:   RRR rate 80. Symmetric bilateral radial and DP pulses.  No murmurs. Cap refill less than 2 seconds. Respiratory:   Normal respiratory effort without tachypnea/retractions. Breath sounds are clear and equal bilaterally. No wheezes/rales/rhonchi. Gastrointestinal:   Soft and nontender. Non distended. There is no CVA tenderness.  No rebound, rigidity, or guarding. Musculoskeletal:   Normal range of motion in all extremities. No joint effusions.  No lower extremity tenderness.  No edema. Neurologic:   Slightly slurred speech.  Normal language.  Steady gait Motor grossly intact. No acute focal neurologic deficits are appreciated.  Skin:    Skin is warm, dry and intact.  There is bruising of her bilateral wrist which patient attributes to handcuffs used by police during transport.  No bony point tenderness or deformity, no lacerations or  hematomas. ____________________________________________    LABS (pertinent positives/negatives) (all labs ordered are listed, but only abnormal results are displayed) Labs Reviewed  COMPREHENSIVE METABOLIC PANEL - Abnormal; Notable for the following components:      Result Value   Sodium 134 (*)    Glucose, Bld 60 (*)    BUN 24 (*)    Creatinine, Ser 1.10 (*)    Total Protein 8.4 (*)    Total Bilirubin 1.5 (*)    GFR calc non Af Amer 54 (*)    All other components within normal limits  ACETAMINOPHEN LEVEL - Abnormal; Notable for the following components:   Acetaminophen (Tylenol),  Serum <10 (*)    All other components within normal limits  CBC  SALICYLATE LEVEL  URINE DRUG SCREEN, QUALITATIVE ([facility] ONLY)   ____________________________________________   EKG    ____________________________________________    RADIOLOGY  No results found.  ____________________________________________   PROCEDURES Procedures  ____________________________________________    CLINICAL IMPRESSION / ASSESSMENT AND PLAN / ED COURSE  Pertinent labs & imaging results that were available during my care of the patient were reviewed by me and considered in my medical decision making (see chart for details).    Patient presents with suspected overdose of multiple medications.  At presentation is been at least 4 hours since the possible event.  She is nontoxic, steady on her feet, slurring her speech a little bit which I attribute alcohol intoxication.  Major concern would be hypotension, but vitals are stable.  Patient not cooperative and limiting work-up  Clinical Course as of May 19 1743  Sat May 19, 2018  1653 Labs unremarkable. Vs normal. Pt won't allow ekg, but at this point, clinically, she is stable and asymptomatic from the suspected ingestion. Stable to continue psychiatric eval.   [PS]    Clinical Course User Index [PS] Carrie Mew, MD     ----------------------------------------- 5:48 PM on 05/19/2018 -----------------------------------------  Patient transferred to [facility] for further evaluation.  Risk-benefit of giving the patient antipsychotics or sedatives to obtain an EKG is unfavorable, will continue to monitor clinically.  Continue involuntary commitment pending psychiatric evaluation.  Labs unremarkable.   ____________________________________________   FINAL CLINICAL IMPRESSION(S) / ED DIAGNOSES    Final diagnoses:  Alcohol abuse  Drug overdose, undetermined intent, initial encounter     ED Discharge Orders    None      Portions of this note were generated with dragon dictation software. Dictation errors may occur despite best attempts at proofreading.   Carrie Mew, MD 05/19/18 [phone]

## 2018-05-20 ENCOUNTER — Inpatient Hospital Stay
Admission: AD | Admit: 2018-05-20 | Discharge: 2018-05-22 | DRG: 885 | Disposition: A | Discharge status: Home / Self Care | Payer: Medicare HMO | Source: Intra-hospital | Attending: Psychiatry | Admitting: Psychiatry

## 2018-05-20 ENCOUNTER — Other Ambulatory Visit: Payer: Self-pay

## 2018-05-20 DIAGNOSIS — Z9119 Patient's noncompliance with other medical treatment and regimen: Secondary | ICD-10-CM | POA: Diagnosis not present

## 2018-05-20 DIAGNOSIS — T50904A Poisoning by unspecified drugs, medicaments and biological substances, undetermined, initial encounter: Secondary | ICD-10-CM | POA: Diagnosis not present

## 2018-05-20 DIAGNOSIS — Z90710 Acquired absence of both cervix and uterus: Secondary | ICD-10-CM | POA: Diagnosis not present

## 2018-05-20 DIAGNOSIS — F332 Major depressive disorder, recurrent severe without psychotic features: Secondary | ICD-10-CM | POA: Diagnosis present

## 2018-05-20 DIAGNOSIS — Z96611 Presence of right artificial shoulder joint: Secondary | ICD-10-CM | POA: Diagnosis present

## 2018-05-20 DIAGNOSIS — E559 Vitamin D deficiency, unspecified: Secondary | ICD-10-CM | POA: Diagnosis present

## 2018-05-20 DIAGNOSIS — Z915 Personal history of self-harm: Secondary | ICD-10-CM

## 2018-05-20 DIAGNOSIS — F410 Panic disorder [episodic paroxysmal anxiety] without agoraphobia: Secondary | ICD-10-CM | POA: Diagnosis present

## 2018-05-20 DIAGNOSIS — Z79899 Other long term (current) drug therapy: Secondary | ICD-10-CM | POA: Diagnosis not present

## 2018-05-20 DIAGNOSIS — F1020 Alcohol dependence, uncomplicated: Secondary | ICD-10-CM | POA: Diagnosis present

## 2018-05-20 DIAGNOSIS — E785 Hyperlipidemia, unspecified: Secondary | ICD-10-CM | POA: Diagnosis present

## 2018-05-20 DIAGNOSIS — I10 Essential (primary) hypertension: Secondary | ICD-10-CM | POA: Diagnosis present

## 2018-05-20 DIAGNOSIS — F17210 Nicotine dependence, cigarettes, uncomplicated: Secondary | ICD-10-CM | POA: Diagnosis present

## 2018-05-20 DIAGNOSIS — Z7982 Long term (current) use of aspirin: Secondary | ICD-10-CM | POA: Diagnosis not present

## 2018-05-20 MED ORDER — HYDROXYZINE HCL 25 MG PO TABS: 25.0000 mg | ORAL_TABLET | Freq: Four times a day (QID) | ORAL | Status: DC | PRN

## 2018-05-20 MED ORDER — HYDROXYZINE HCL 25 MG PO TABS
25.0000 mg | ORAL_TABLET | Freq: Every evening | ORAL | Status: DC | PRN
  Administered 2018-05-20: 25 mg via ORAL
  Filled 2018-05-20: qty 1

## 2018-05-20 MED ORDER — ADULT MULTIVITAMIN W/MINERALS CH
1.0000 | ORAL_TABLET | Freq: Every day | ORAL | Status: DC
  Administered 2018-05-21 – 2018-05-22 (×2): 1 via ORAL
  Filled 2018-05-20 (×2): qty 1

## 2018-05-20 MED ORDER — ACETAMINOPHEN 325 MG PO TABS
650.0000 mg | ORAL_TABLET | Freq: Four times a day (QID) | ORAL | Status: DC | PRN
  Administered 2018-05-21 (×2): 650 mg via ORAL
  Filled 2018-05-20 (×2): qty 2

## 2018-05-20 MED ORDER — VITAMIN B-12 1000 MCG PO TABS
1000.0000 ug | ORAL_TABLET | Freq: Every day | ORAL | Status: DC
  Administered 2018-05-21 – 2018-05-22 (×2): 1000 ug via ORAL
  Filled 2018-05-20 (×2): qty 1

## 2018-05-20 MED ORDER — HYDROXYZINE HCL 25 MG PO TABS
25.0000 mg | ORAL_TABLET | ORAL | Status: DC | PRN
  Administered 2018-05-21 (×2): 25 mg via ORAL
  Filled 2018-05-20 (×3): qty 1

## 2018-05-20 MED ORDER — HYDROCHLOROTHIAZIDE 25 MG PO TABS
25.0000 mg | ORAL_TABLET | Freq: Every day | ORAL | Status: DC
  Administered 2018-05-21: 25 mg via ORAL
  Filled 2018-05-20: qty 1

## 2018-05-20 MED ORDER — TRAZODONE HCL 100 MG PO TABS
100.0000 mg | ORAL_TABLET | Freq: Every evening | ORAL | Status: DC | PRN
  Administered 2018-05-21: 100 mg via ORAL
  Filled 2018-05-20: qty 1

## 2018-05-20 MED ORDER — MAGNESIUM HYDROXIDE 400 MG/5ML PO SUSP: 30.0000 mL | Freq: Every day | ORAL | Status: DC | PRN

## 2018-05-20 MED ORDER — LORAZEPAM 1 MG PO TABS
1.0000 mg | ORAL_TABLET | Freq: Four times a day (QID) | ORAL | Status: DC | PRN
  Administered 2018-05-20: 1 mg via ORAL
  Filled 2018-05-20: qty 1

## 2018-05-20 MED ORDER — ALUM & MAG HYDROXIDE-SIMETH 200-200-20 MG/5ML PO SUSP: 30.0000 mL | ORAL | Status: DC | PRN

## 2018-05-20 MED ORDER — ONDANSETRON 4 MG PO TBDP: 4.0000 mg | ORAL_TABLET | Freq: Four times a day (QID) | ORAL | Status: DC | PRN

## 2018-05-20 MED ORDER — ACETAMINOPHEN 325 MG PO TABS
650.0000 mg | ORAL_TABLET | Freq: Once | ORAL | Status: DC
  Filled 2018-05-20: qty 2

## 2018-05-20 MED ORDER — LOSARTAN POTASSIUM 50 MG PO TABS
100.0000 mg | ORAL_TABLET | Freq: Every day | ORAL | Status: DC
  Administered 2018-05-21: 100 mg via ORAL
  Filled 2018-05-20 (×2): qty 2

## 2018-05-20 MED ORDER — VITAMIN B-1 100 MG PO TABS
100.0000 mg | ORAL_TABLET | Freq: Every day | ORAL | Status: DC
  Administered 2018-05-21 – 2018-05-22 (×2): 100 mg via ORAL
  Filled 2018-05-20 (×2): qty 1

## 2018-05-20 MED ORDER — LORAZEPAM 2 MG PO TABS: 2.0000 mg | ORAL_TABLET | Freq: Four times a day (QID) | ORAL | Status: DC | PRN

## 2018-05-20 MED ORDER — VITAMIN D 25 MCG (1000 UNIT) PO TABS
1000.0000 [IU] | ORAL_TABLET | Freq: Every day | ORAL | Status: DC
  Administered 2018-05-21 – 2018-05-22 (×2): 1000 [IU] via ORAL
  Filled 2018-05-20 (×2): qty 1

## 2018-05-20 MED ORDER — LOPERAMIDE HCL 2 MG PO CAPS: 2.0000 mg | ORAL_CAPSULE | ORAL | Status: DC | PRN

## 2018-05-20 NOTE — BH Assessment (Addendum)
Patient is to be admitted to [facility] by Dr. Minette Brine.  Attending Physician will be Dr. Weber Cooks.   Patient has been assigned to room 323, by [facility] Charge Nurse [place] staff is aware of the admission:  Dr. Corky Downs, ER MD  Oley Balm, Patient Access.

## 2018-05-20 NOTE — ED Notes (Signed)
Hourly rounding reveals patient sleeping in room. No complaints, stable, in no acute distress. Q15 minute rounds and monitoring via Security Cameras to continue. 

## 2018-05-20 NOTE — Consult Note (Signed)
[facility] Face-to-Face Psychiatry Consult   Reason for Consult:  Possible Overdose, maybe with Klonopin, ,alcohol consumption Referring Physician:  ED Patient Identification: Erin Lewis MRN:  [id number] Principal Diagnosis: Major depressive disorder, recurrent, severe without psychotic features ([place]) Diagnosis:  Principal Problem:   Major depressive disorder, recurrent, severe without psychotic features ([place]) Active Problems:   Anxiety reaction   HTN (hypertension)   Alcohol use disorder, moderate, dependence ([place])   Total Time spent with patient: 1 hour  Subjective:   Erin Lewis is a 63 y.o. female patient brought in by EMS due to possible overdose.  The patient is not able or willing to provide a clear history but she states that she does not know why she was here.  She states it was a joke that went wrong.  She states she and her son were talking about stuff and somehow she ended up here in the hospital.  Apparently she claimed that she taken 15 Valium although she does not have a prescription for Valium.  She states that she drank a beer and she can drink in her own home.  She denies any intent of hurting herself.  Ethanol level was not obtained.  Urine drug screen has not been obtained, order entered.  Labs obtained include an unremarkable CMP CBC negative acetaminophen salicylate levels.  The patient denies taking overdose however she was found unresponsive on the floor at her home.  She was apparently unresponsive for a couple of minutes.  No Valium was found by EMS, they found some old bottles of losartan-hydrochlorothiazide hydralazine Klonopin, unclear if she overdosed on those medications or not.  HPI: The patient states that she does not know why she is here she states that she had 1 beer, she states she drinks 1 beer a day and took one half of a Klonopin to try to rest.  She states she does not take the Klonopin every day and just takes 1/2 tablet when she needs to for rest.   She states she lives with her husband.  She was very tearful when I interviewed her due to overhearing another patient's comments she claims.  She states it was very depressing for her but she denies any current depression or intention to hurt herself.  She states she was treated for depression many years ago but did not like the medication.  She states that she became disabled due to her problems with her right shoulder she had a Couple of rotator cuff surgeries and a shoulder replacement surgery and had to go on disability.  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  She states getting out of the house and that sitting around all the time has helped her mood.  [place] controlled substance system review last prescription for Klonopin 0.5 mg number 3030-day supply filled on 2/29/2027 based on this it is possible that she could have overdosed on the Klonopin prescription she obtained 2 weeks ago.  Call to husband: no answer Call to sister Erin Roch [phone]: no answer  Past Psychiatric History: depression history  Risk to Self: Suicidal Ideation: No Suicidal Intent: No Is patient at risk for suicide?: No Suicidal Plan?: No Access to Means: No What has been your use of drugs/alcohol within the last 12 months?: PT DENIES USE How many times?: 0 Other Self Harm Risks: Possible overdose Triggers for Past Attempts: None known, Unknown Intentional Self Injurious Behavior: Damaging Comment - Self Injurious Behavior: Possible  overdose Risk to Others: Homicidal Ideation: No Thoughts of Harm to Others: No Current Homicidal Intent: No Current Homicidal Plan: No Access to Homicidal Means: No Identified Victim: n/a History of harm to others?: No Assessment of Violence: On admission Violent Behavior Description: pt can be loud and aggressive when upset Does patient have access to weapons?: No Criminal Charges Pending?: No Does patient have a court date:  No Prior Inpatient Therapy: Prior Inpatient Therapy: Yes Prior Therapy Dates: "years ago" Prior Therapy Facilty/Provider(s): [facility] Reason for Treatment: Overdose Prior Outpatient Therapy: Prior Outpatient Therapy: No Does patient have an ACCT team?: No Does patient have Intensive In-House Services?  : No Does patient have [facility] services? : No Does patient have P4CC services?: No  Past Medical History:  Past Medical History:  Diagnosis Date  . Hyperlipidemia   . Hypertension     Past Surgical History:  Procedure Laterality Date  . [facility]  . CESAREAN SECTION     x2  . COLONOSCOPY WITH PROPOFOL N/A 07/20/2016   Procedure: COLONOSCOPY WITH PROPOFOL;  Surgeon: Robert Bellow, MD;  Location: [facility] ENDOSCOPY;  Service: Endoscopy;  Laterality: N/A;  . laproscopy     abdomen  . SHOULDER ARTHROSCOPY W/ ROTATOR CUFF REPAIR     x2  . TOTAL SHOULDER ARTHROPLASTY Right 6-7 years ago   Family History:  Family History  Problem Relation Age of Onset  . Hypertension Other   . Diabetes Other   . Colon polyps Father 110  . Breast cancer Neg Hx     Social History:  Social History   Substance and Sexual Activity  Alcohol Use Yes  . Frequency: Never   Comment: occasionally     Social History   Substance and Sexual Activity  Drug Use No    Social History   Socioeconomic History  . Marital status: Married    Spouse name: Not on file  . Number of children: Not on file  . Years of education: Not on file  . Highest education level: Not on file  Occupational History  . Not on file  Social Needs  . Financial resource strain: Not on file  . Food insecurity:    Worry: Not on file    Inability: Not on file  . Transportation needs:    Medical: Not on file    Non-medical: Not on file  Tobacco Use  . Smoking status: Current Every Day Smoker    Packs/day: 0.50    Types: Cigarettes  . Smokeless tobacco: Never Used  Substance and Sexual  Activity  . Alcohol use: Yes    Frequency: Never    Comment: occasionally  . Drug use: No  . Sexual activity: Not on file  Lifestyle  . Physical activity:    Days per week: Not on file    Minutes per session: Not on file  . Stress: Not on file  Relationships  . Social connections:    Talks on phone: Not on file    Gets together: Not on file    Attends religious service: Not on file    Active member of club or organization: Not on file    Attends meetings of clubs or organizations: Not on file    Relationship status: Not on file  Other Topics Concern  . Not on file  Social History Narrative  . Not on file   Additional Social History:    Allergies:  No Known Allergies  Labs:  Results for orders placed or performed during the hospital encounter of 05/19/18 (from the past 48 hour(s))  CBC     Status: None   Collection Time: 05/19/18  3:41 PM  Result Value Ref Range   WBC 8.7 4.0 - 10.5 K/uL   RBC 4.72 3.87 - 5.11 MIL/uL   Hemoglobin 14.6 12.0 - 15.0 g/dL   HCT 43.6 36.0 - 46.0 %   MCV 92.4 80.0 - 100.0 fL   MCH 30.9 26.0 - 34.0 pg   MCHC 33.5 30.0 - 36.0 g/dL   RDW 14.3 11.5 - 15.5 %   Platelets 327 150 - 400 K/uL   nRBC 0.0 0.0 - 0.2 %    Comment: Performed at [facility], [address] [place], [place] [address]  Comprehensive metabolic panel     Status: Abnormal   Collection Time: 05/19/18  3:41 PM  Result Value Ref Range   Sodium 134 (L) 135 - 145 mmol/L   Potassium 4.2 3.5 - 5.1 mmol/L   Chloride 100 98 - 111 mmol/L   CO2 22 22 - 32 mmol/L   Glucose, Bld 60 (L) 70 - 99 mg/dL   BUN 24 (H) 8 - 23 mg/dL   Creatinine, Ser 1.10 (H) 0.44 - 1.00 mg/dL   Calcium 9.4 8.9 - 10.3 mg/dL   Total Protein 8.4 (H) 6.5 - 8.1 g/dL   Albumin 4.7 3.5 - 5.0 g/dL   AST 33 15 - 41 U/L   ALT 18 0 - 44 U/L   Alkaline Phosphatase 69 38 - 126 U/L   Total Bilirubin 1.5 (H) 0.3 - 1.2 mg/dL   GFR calc non Af Amer 54 (L) >60 mL/min   GFR calc Af Amer >60 >60 mL/min    Anion gap 12 5 - 15    Comment: Performed at [facility], [address] [place], [place] [address]  Acetaminophen level     Status: Abnormal   Collection Time: 05/19/18  3:41 PM  Result Value Ref Range   Acetaminophen (Tylenol), Serum <10 (L) 10 - 30 ug/mL    Comment: (NOTE) Therapeutic concentrations vary significantly. A range of 10-30 ug/mL  may be an effective concentration for many patients. However, some  are best treated at concentrations outside of this range. Acetaminophen concentrations >150 ug/mL at 4 hours after ingestion  and >50 ug/mL at 12 hours after ingestion are often associated with  toxic reactions. Performed at [facility], [address] [place], [place] [address]   Salicylate level     Status: None   Collection Time: 05/19/18  3:41 PM  Result Value Ref Range   Salicylate Lvl <0.1 2.8 - 30.0 mg/dL    Comment: Performed at [facility], [address] [place], [place] [address]    Current Facility-Administered Medications  Medication Dose Route Frequency Provider Last Rate Last Dose  . clindamycin (CLEOCIN) 600 mg in dextrose 5 % 50 mL IVPB  600 mg Intravenous Once Byrnett, Forest Gleason, MD       Current Outpatient Medications  Medication Sig Dispense Refill  . aspirin EC 81 MG tablet Take 81 mg by mouth daily.    Marland Kitchen atorvastatin (LIPITOR) 20 MG tablet Take 20 mg by mouth daily.    . clonazePAM (KLONOPIN) 0.5 MG tablet Take 0.5 mg by mouth 2 (two) times daily.     . ergocalciferol (VITAMIN D2) 50000 units capsule Take 50,000 Units by mouth once a week.    . hydrOXYzine (  ATARAX/VISTARIL) 25 MG tablet Take 25 mg by mouth at bedtime.     Marland Kitchen losartan-hydrochlorothiazide (HYZAAR) 100-25 MG tablet Take 1 tablet by mouth daily.      Musculoskeletal: Strength & Muscle Tone: within normal limits Gait & Station: normal Patient leans: N/A  Psychiatric Specialty Exam: Physical Exam  Constitutional: She is oriented to person, place,  and time. She appears well-developed and well-nourished.  HENT:  Head: Normocephalic and atraumatic.  Eyes: Pupils are equal, round, and reactive to light. Conjunctivae and EOM are normal.  Neck: Normal range of motion. Neck supple.  Cardiovascular: Normal rate and regular rhythm.  Respiratory: Effort normal.  Musculoskeletal: Normal range of motion.     Comments: Decreased ROM right shoulder post injury and surgery, chronic problem  Neurological: She is alert and oriented to person, place, and time.  Skin: Skin is warm and dry.    ROS  Blood pressure 110/77, pulse 66, temperature 98.2 F (36.8 C), temperature source Oral, resp. rate 14, height 5\' 5"  (1.651 m), weight 68 kg, SpO2 96 %.Body mass index is 24.96 kg/m.  General Appearance: Disheveled  Eye Contact:  Good  Speech:  Clear and Coherent  Volume:  Normal  Mood:  Anxious and labile  Affect:  Congruent and Tearful  Thought Process:  Coherent  Orientation:  Full (Time, Place, and Person)  Thought Content:  Logical  Suicidal Thoughts:  denies  Homicidal Thoughts:  No  Memory:  Immediate;   Good Recent;   Good Remote;   Good  Judgement:  Impaired  Insight:  Shallow  Psychomotor Activity:  Normal  Concentration:  Concentration: Good and Attention Span: Good  Recall:  Good except doesn't recall events at home, found unresponsive  Fund of Knowledge:  Good  Language:  Good  Akathisia:  No  Handed:  Right  AIMS (if indicated):     Assets:  Communication Skills Housing  ADL's:  Intact  Cognition:  WNL  Sleep:        Treatment Plan Summary: Daily contact with patient to assess and evaluate symptoms and progress in treatment, Medication management and Plan monitor for alcohol withdrawal, consider antidepressant treatment if willing, consider consider medication assisted treatment for alcohol if indeed patient has alcohol use disorder, seems likely  Disposition: Recommend psychiatric Inpatient admission when medically  cleared.  Patience Musca, MD 05/20/2018 12:33 PM

## 2018-05-20 NOTE — ED Notes (Signed)
Hourly rounding reveals patient in room. No complaints, stable, in no acute distress. Q15 minute rounds and monitoring via Security Cameras to continue. 

## 2018-05-20 NOTE — BH Assessment (Signed)
Assessment Note  Erin Lewis is an 63 y.o. female who presents to the ED following a possible  dru overdose. Pt reports that she was talking with her son and "Delene Ruffini' said that she had taken 67 of her prescription valium pills. She reports that her son then called the police and they showed up at her house, placed her under IVC, and brought her into the ER.   During the assessment she was guarded and refused to give straight forward answers. The pt is in denial about any substance use and reports "I was just playing with him I don't know why he called anyone."   Pt had a prior admission to the [facility] due to past overdose by pill during a separation with her husband. Pt denies SI/HI A/V H/D at this time.   Diagnosis: Overdose  Past Medical History:  Past Medical History:  Diagnosis Date  . Hyperlipidemia   . Hypertension     Past Surgical History:  Procedure Laterality Date  . [facility]  . CESAREAN SECTION     x2  . COLONOSCOPY WITH PROPOFOL N/A 07/20/2016   Procedure: COLONOSCOPY WITH PROPOFOL;  Surgeon: Robert Bellow, MD;  Location: [facility] ENDOSCOPY;  Service: Endoscopy;  Laterality: N/A;  . laproscopy     abdomen  . SHOULDER ARTHROSCOPY W/ ROTATOR CUFF REPAIR     x2  . TOTAL SHOULDER ARTHROPLASTY Right 6-7 years ago    Family History:  Family History  Problem Relation Age of Onset  . Hypertension Other   . Diabetes Other   . Colon polyps Father 52  . Breast cancer Neg Hx     Social History:  reports that she has been smoking cigarettes. She has been smoking about 0.50 packs per day. She has never used smokeless tobacco. She reports current alcohol use. She reports that she does not use drugs.  Additional Social History:  Alcohol / Drug Use Pain Medications: SEE MAR Prescriptions: SEE MAR Over the Counter: SEE MAR History of alcohol / drug use?: Yes(Pt uses etoh but won't reveal how much) Longest period of sobriety (when/how  long): unknown Substance #1 Name of Substance 1: ETOH  CIWA: CIWA-Ar BP: 134/83 Pulse Rate: 68 COWS:    Allergies: No Known Allergies  Home Medications: (Not in a hospital admission)   OB/GYN Status:  No LMP recorded. Patient is postmenopausal.  General Assessment Data Location of Assessment: [facility] ED TTS Assessment: In system Is this a Tele or Face-to-Face Assessment?: Face-to-Face Is this an Initial Assessment or a Re-assessment for this encounter?: Initial Assessment Patient Accompanied by:: N/A Language Other than English: No Living Arrangements: Other (Comment) What gender do you identify as?: Female Marital status: Separated Pregnancy Status: No Living Arrangements: Spouse/significant other, Alone Can pt return to current living arrangement?: Yes Admission Status: Involuntary Petitioner: Family member Is patient capable of signing voluntary admission?: No Referral Source: Self/Family/Friend Insurance type: [place]  Medical Screening Exam ([place]) Medical Exam completed: Yes  Crisis Care Plan Living Arrangements: Spouse/significant other, Alone Legal Guardian: Other: Name of Psychiatrist: N/A Name of Therapist: N/A  Education Status Is patient currently in school?: No Is the patient employed, unemployed or receiving disability?: Unemployed  Risk to self with the past 6 months Suicidal Ideation: No Has patient been a risk to self within the past 6 months prior to admission? : No Suicidal Intent: No Has patient had any suicidal intent within the past 6 months prior  to admission? : No Is patient at risk for suicide?: No Suicidal Plan?: No Has patient had any suicidal plan within the past 6 months prior to admission? : No Access to Means: No What has been your use of drugs/alcohol within the last 12 months?: PT DENIES USE Previous Attempts/Gestures: No How many times?: 0 Other Self Harm Risks: Possible overdose Triggers for Past Attempts: None  known, Unknown Intentional Self Injurious Behavior: Damaging Comment - Self Injurious Behavior: Possible overdose Family Suicide History: Unknown Recent stressful life event(s): Divorce Persecutory voices/beliefs?: No Depression: Yes Depression Symptoms: Feeling worthless/self pity Substance abuse history and/or treatment for substance abuse?: Yes Suicide prevention information given to non-admitted patients: Not applicable  Risk to Others within the past 6 months Homicidal Ideation: No Does patient have any lifetime risk of violence toward others beyond the six months prior to admission? : No Thoughts of Harm to Others: No Current Homicidal Intent: No Current Homicidal Plan: No Access to Homicidal Means: No Identified Victim: n/a History of harm to others?: No Assessment of Violence: On admission Violent Behavior Description: pt can be loud and aggressive when upset Does patient have access to weapons?: No Criminal Charges Pending?: No Does patient have a court date: No Is patient on probation?: No  Psychosis Hallucinations: None noted Delusions: None noted  Mental Status Report Appearance/Hygiene: In scrubs, Unremarkable Eye Contact: Poor Motor Activity: Rigidity Speech: Logical/coherent Level of Consciousness: Sleeping, Drowsy Mood: Depressed, Sad Affect: Appropriate to circumstance Anxiety Level: Minimal Thought Processes: Thought Blocking Judgement: Partial Orientation: Unable to assess Obsessive Compulsive Thoughts/Behaviors: None  Cognitive Functioning Concentration: Decreased Memory: Unable to Assess Is patient IDD: No Insight: Poor Impulse Control: Poor Appetite: Poor Have you had any weight changes? : No Change Sleep: Decreased Total Hours of Sleep: 2 Vegetative Symptoms: None  ADLScreening [facility] Assessment Services) Patient's cognitive ability adequate to safely complete daily activities?: Yes Patient able to express need for assistance with ADLs?:  Yes Independently performs ADLs?: Yes (appropriate for developmental age)  Prior Inpatient Therapy Prior Inpatient Therapy: Yes Prior Therapy Dates: "years ago" Prior Therapy Facilty/Provider(s): [facility] Reason for Treatment: Overdose  Prior Outpatient Therapy Prior Outpatient Therapy: No Does patient have an ACCT team?: No Does patient have Intensive In-House Services?  : No Does patient have [facility] services? : No Does patient have P4CC services?: No  ADL Screening (condition at time of admission) Patient's cognitive ability adequate to safely complete daily activities?: Yes Is the patient deaf or have difficulty hearing?: No Does the patient have difficulty seeing, even when wearing glasses/contacts?: No Does the patient have difficulty concentrating, remembering, or making decisions?: No Patient able to express need for assistance with ADLs?: Yes Does the patient have difficulty dressing or bathing?: No Independently performs ADLs?: Yes (appropriate for developmental age) Does the patient have difficulty walking or climbing stairs?: No Weakness of Legs: None Weakness of Arms/Hands: None  Home Assistive Devices/Equipment Home Assistive Devices/Equipment: None  Therapy Consults (therapy consults require a physician order) PT Evaluation Needed: No OT Evalulation Needed: No SLP Evaluation Needed: No Abuse/Neglect Assessment (Assessment to be complete while patient is alone) Abuse/Neglect Assessment Can Be Completed: Yes Physical Abuse: Denies Verbal Abuse: Denies Sexual Abuse: Denies Exploitation of patient/patient's resources: Denies Self-Neglect: Denies Values / Beliefs Cultural Requests During Hospitalization: None Spiritual Requests During Hospitalization: None Consults Spiritual Care Consult Needed: No Social Work Consult Needed: No Regulatory affairs officer (For Healthcare) Does Patient Have a Medical Advance Directive?: No Would patient like information on creating  a medical advance directive?: No - Patient declined          Disposition:  Disposition Initial Assessment Completed for this Encounter: Yes Disposition of Patient: (Pending) Patient refused recommended treatment: No Mode of transportation if patient is discharged/movement?: Car Patient referred to: (Pending disposition)  On Site Evaluation by:   Reviewed with Physician:    Frederick Marro D Elizeth Weinrich 05/20/2018 2:02 AM

## 2018-05-20 NOTE — ED Notes (Signed)
Report to include Situation, Background, Assessment, and Recommendations received from [facility]. Patient alert, warm and dry, in no acute distress. Patient refuses to answer questions related to SI, HI, AVH and pain. Patient made aware of Q15 minute rounds and security cameras for their safety. Patient instructed to come to me with needs or concerns.

## 2018-05-20 NOTE — Progress Notes (Signed)
Pt noted in dayroom to be tearful, irritated and verbally hostile on interactions. Pt observed standing up in dayroom and slumming to the floor. Orthostatic vitals done and recorded. EDP notified. New order received for Tylenol for c/o right arm pain "no, I don't want it, it's not that type of pain" "It's not that type of pain, I think I'm having heart problem". Denies SI, HI and AVH "I need to go home or y'all send me where ever I need to go". Emotional support and encouragement provided to pt. Q 15 minutes safety checks maintained. No injury or self harm gestures noted at this time. Pt pending placement.

## 2018-05-20 NOTE — ED Notes (Signed)
Nurse talked to patient and she is upset that she was handled roughly by the police when coming here, states " they treated me like I was a criminal, and my son did this to me because He was mad at me" I don't need to be here, nurse encouraged patient to be patient and look at it as a learning experience to be able to assist her with coping skills, she denies si/hi or avh, and she denies addiction. Nurse will continue to monitor.

## 2018-05-20 NOTE — ED Notes (Signed)
Patient refuses food and drink, states " I have not ate for 8 days and I don't want anything because I have to loose 25lb, Patient encouraged to have at least some water, but she refuses, she is teary eyed, she states " I was treated like a animal by [employer] and I want to talk to my lawyer" nurse will continue to monitor. Patient denies Si/hi or avh.

## 2018-05-20 NOTE — ED Provider Notes (Signed)
-----------------------------------------   3:14 AM on 05/20/2018 -----------------------------------------   Blood pressure 134/83, pulse 68, temperature 97.9 F (36.6 C), resp. rate 16, height 5\' 5"  (1.651 m), weight 68 kg, SpO2 100 %.  The patient is calm and resting.  There have been no acute events since the last update.  Awaiting disposition plan from Behavioral Medicine team.    Delman Kitten, MD 05/20/18 [phone]

## 2018-05-21 ENCOUNTER — Other Ambulatory Visit: Payer: Self-pay

## 2018-05-21 DIAGNOSIS — F332 Major depressive disorder, recurrent severe without psychotic features: Principal | ICD-10-CM

## 2018-05-21 LAB — LIPID PANEL
Cholesterol: 199 mg/dL (ref 0–200)
HDL: 84 mg/dL (ref >40)
LDL Cholesterol: 73 mg/dL (ref 0–99)
Total CHOL/HDL Ratio: 2.4 RATIO
Triglycerides: 212 mg/dL — ABNORMAL HIGH (ref <150)
VLDL: 42 mg/dL — ABNORMAL HIGH (ref 0–40)

## 2018-05-21 LAB — TSH: TSH: 5.492 u[IU]/mL — ABNORMAL HIGH (ref 0.350–4.500)

## 2018-05-21 LAB — HEMOGLOBIN A1C
Hgb A1c MFr Bld: 5.4 % (ref 4.8–5.6)
Mean Plasma Glucose: 108.28 mg/dL

## 2018-05-21 MED ORDER — NICOTINE 21 MG/24HR TD PT24
21.0000 mg | MEDICATED_PATCH | Freq: Every day | TRANSDERMAL | Status: DC
  Administered 2018-05-21 – 2018-05-22 (×2): 21 mg via TRANSDERMAL
  Filled 2018-05-21 (×2): qty 1

## 2018-05-21 MED ORDER — ENSURE MAX PROTEIN PO LIQD
11.0000 [oz_av] | Freq: Two times a day (BID) | ORAL | Status: DC
  Administered 2018-05-21 – 2018-05-22 (×2): 11 [oz_av] via ORAL
  Filled 2018-05-21: qty 330

## 2018-05-21 NOTE — Plan of Care (Signed)
Patient was sad and tearful this morning.Stated "I did not do anything,why should I stay here.My anxiety is up here."Patient calm down and appropriate in the unit later today.Compliant with medications.Denies SI,HI and AVH.Looking forward to go home tomorrow.Support and encouragement given.

## 2018-05-21 NOTE — Progress Notes (Signed)
Recreation Therapy Notes  INPATIENT RECREATION THERAPY ASSESSMENT  Patient Details Name: Erin Lewis MRN: [id number] DOB: February 11, 1956 Today's Date: 05/21/2018       Information Obtained From: Patient  Able to Participate in Assessment/Interview: Yes  Patient Presentation: Responsive  Reason for Admission (Per Patient): Other (Comments)(A misunderstanding)  Patient Stressors:    Coping Skills:   Haematologist (2+):  (Nothing)  Frequency of Recreation/Participation:    Awareness of Community Resources:     [employer]:     Current Use:    If no, Barriers?:    Expressed Interest in [employer] Information:    [employer] of Residence:  Insurance underwriter  Patient Main Form of Transportation: Musician  Patient Strengths:  I tell the truth  Patient Identified Areas of Improvement:  Nothing I stay out of folks bussiness  Patient Goal for Hospitalization:  Nothing I am here for no reason  Current SI (including self-harm):  No  Current HI:  No  Current AVH: No  Staff Intervention Plan: Group Attendance, Collaborate with Interdisciplinary Treatment Team  Consent to Intern Participation: N/A  Erin Lewis 05/21/2018, 2:31 PM

## 2018-05-21 NOTE — BHH Suicide Risk Assessment (Signed)
[facility] Admission Suicide Risk Assessment   Nursing information obtained from:  Patient Demographic factors:  Divorced or widowed, Living alone Current Mental Status:  Suicidal ideation indicated by others Loss Factors:  NA Historical Factors:  NA Risk Reduction Factors:  Religious beliefs about death  Total Time spent with patient: 1 hour Principal Problem: <principal problem not specified> Diagnosis:  Active Problems:   Recurrent major depression-severe (HCC)  Subjective Data: Patient interviewed chart reviewed.  Patient admitted on allegations of making an overdose.  Patient has consistently denied suicidal intent or ideation.  Denies suicidal ideation now.  Admits that mood has been stressed out and depressed at times but minimizes the effect on her.  Denies psychotic symptoms denies homicidal ideation.  Continued Clinical Symptoms:  Alcohol Use Disorder Identification Test Final Score (AUDIT): 2 The "Alcohol Use Disorders Identification Test", Guidelines for Use in Primary Care, Second Edition.  World Pharmacologist [facility]). Score between 0-7:  no or low risk or alcohol related problems. Score between 8-15:  moderate risk of alcohol related problems. Score between 16-19:  high risk of alcohol related problems. Score 20 or above:  warrants further diagnostic evaluation for alcohol dependence and treatment.   CLINICAL FACTORS:   Depression:   Impulsivity Alcohol/Substance Abuse/Dependencies   Musculoskeletal: Strength & Muscle Tone: within normal limits Gait & Station: normal Patient leans: N/A  Psychiatric Specialty Exam: Physical Exam  Nursing note and vitals reviewed. Constitutional: She appears well-developed and well-nourished.  HENT:  Head: Normocephalic and atraumatic.  Eyes: Pupils are equal, round, and reactive to light. Conjunctivae are normal.  Neck: Normal range of motion.  Cardiovascular: Regular rhythm and normal heart sounds.  Respiratory: Effort normal.   GI: Soft.  Musculoskeletal: Normal range of motion.  Neurological: She is alert.  Skin: Skin is warm and dry.  Psychiatric: Her mood appears anxious. Her affect is labile. Her speech is rapid and/or pressured. She is agitated. She is not aggressive. Thought content is paranoid. Cognition and memory are impaired. She expresses impulsivity. She expresses no homicidal and no suicidal ideation.    Review of Systems  Constitutional: Negative.   HENT: Negative.   Eyes: Negative.   Respiratory: Negative.   Cardiovascular: Negative.   Gastrointestinal: Negative.   Musculoskeletal: Negative.   Skin: Negative.   Neurological: Negative.   Psychiatric/Behavioral: Positive for memory loss and substance abuse. Negative for depression, hallucinations and suicidal ideas. The patient is nervous/anxious and has insomnia.     Blood pressure 105/65, pulse 76, temperature 98.5 F (36.9 C), temperature source Oral, resp. rate 18, height 5\' 5"  (1.651 m), weight 72.6 kg, SpO2 100 %.Body mass index is 26.63 kg/m.  General Appearance: Casual  Eye Contact:  Fair  Speech:  Pressured  Volume:  Increased  Mood:  Angry and Irritable  Affect:  Inappropriate  Thought Process:  Disorganized  Orientation:  Full (Time, Place, and Person)  Thought Content:  Tangential  Suicidal Thoughts:  No  Homicidal Thoughts:  No  Memory:  Immediate;   Fair Recent;   Poor Remote;   Poor  Judgement:  Impaired  Insight:  Shallow  Psychomotor Activity:  Restlessness  Concentration:  Concentration: Poor  Recall:  [place] of Knowledge:  Fair  Language:  Poor  Akathisia:  No  Handed:  Right  AIMS (if indicated):     Assets:  Desire for Improvement Housing  ADL's:  Intact  Cognition:  WNL  Sleep:  Number of Hours: 4      COGNITIVE  FEATURES THAT CONTRIBUTE TO RISK:  Closed-mindedness    SUICIDE RISK:   Minimal: No identifiable suicidal ideation.  Patients presenting with no risk factors but with morbid  ruminations; may be classified as minimal risk based on the severity of the depressive symptoms  PLAN OF CARE: Patient is consistently denying suicidal ideation.  Still showing labile mood however and some inappropriate behavior.  Continue 1515-minute checks and monitoring.  Patient is refusing to take any psychiatric medicine at this point.  Reassess in the morning.  I certify that inpatient services furnished can reasonably be expected to improve the patient's condition.   Alethia Berthold, MD 05/21/2018, 6:41 PM

## 2018-05-21 NOTE — Plan of Care (Signed)
D: Patient was admitted from [facility] after being IVC'd by her son for allegedly overdosing on Klonopin. The patient denies overdosing, denies being suicidal. She states that her son is bipolar and that he did this to her in retaliation for not taking his side in an argument he had with his wife. She states he is bipolar and that he got angry and destroyed his wife's property. Patient lives alone. 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 She denies substance abuse. Says she smokes 1/2 ppd and drinks 1-2 beers only on weekends. She says that when th police came to her house they found an empty bottle of Klonopin but insists she only takes one per night for sleep and denies abuse or any attempt at suicide ever. She does admit to having been hospitalized for mental health in the past but says it was a long time ago. Says she has not eaten in 8 days because she is on a diet and that she has not had any water or fluids for 4 days. She says she fell when she was on BHU but that was not passed on in report. Denies current SI, HI, AV hallucinations and withdrawal symptoms A: Continue to monitor and offer support. R: Safety maintained

## 2018-05-21 NOTE — [facility] Counselor (Signed)
Adult Comprehensive Assessment  Patient ID: Erin Lewis, female   DOB: [birth year], 63 y.o.   MRN: [id number]  Information Source: Information source: Patient  Current Stressors:  Patient states their primary concerns and needs for treatment are:: Pt says she brought to the hospital because her son falsely accused her of trying to overdose on medication. She reports her son got upset with her because did not agree with destroying his wife's property. Patient states their goals for this hospitilization and ongoing recovery are:: "I don't have a goal, I want to go home" Educational / Learning stressors: None reported Employment / Job issues: None reported Family Relationships: Discord with son and husband Museum/gallery curator / Lack of resources (include bankruptcy): None reported Housing / Lack of housing: Stable housing Physical health (include injuries & life threatening diseases): None reported Social relationships: Pt says she has no friends, prefers to be alone Substance abuse: Pt says she drinks 1-2 beers on weekend. Denies any drug use. Bereavement / Loss: None reported  Living/Environment/Situation:  Living Arrangements: Alone Living conditions (as described by patient or guardian): "Very comfortable" Who else lives in the home?: Lives alone How long has patient lived in current situation?: 25 yrs What is atmosphere in current home: Comfortable  Family History:  Marital status: Separated Separated, when?: Pt says she is separated from her husband, married for 35 yrs What types of issues is patient dealing with in the relationship?: Infidelity Additional relationship information: None reported Are you sexually active?: No What is your sexual orientation?: Heterosexual Has your sexual activity been affected by drugs, alcohol, medication, or emotional stress?: None reported Does patient have children?: Yes How many children?: 2 How is patient's relationship with their children?: Pt  reports having a good relationship with her oldest son(Erin Lewis); "volatile relationship with Erin Lewis"  Childhood History:  By whom was/is the patient raised?: Both parents Additional childhood history information: None reported Description of patient's relationship with caregiver when they were a child: "Very supportive" Patient's description of current relationship with people who raised him/her: "Very supportive" How were you disciplined when you got in trouble as a child/adolescent?: Unknown Does patient have siblings?: Yes Number of Siblings: 5 Description of patient's current relationship with siblings: Pt reports they are a close knit family Did patient suffer any verbal/emotional/physical/sexual abuse as a child?: No Did patient suffer from severe childhood neglect?: No Has patient ever been sexually abused/assaulted/raped as an adolescent or adult?: No Was the patient ever a victim of a crime or a disaster?: No Witnessed domestic violence?: No Has patient been effected by domestic violence as an adult?: No  Education:  Highest grade of school patient has completed: 12th Currently a student?: No Learning disability?: No  Employment/Work Situation:   Employment situation: Employed Where is patient currently employed?: [employer] long has patient been employed?: 20+ yrs Patient's job has been impacted by current illness: No What is the longest time patient has a held a job?: 20+ yrs Where was the patient employed at that time?: [employer] Did You Receive Any Psychiatric Treatment/Services While in the [employer]?: No Are There Guns or Other Weapons in [place]?: No Are These [place]?: (Pt denies access)  Financial Resources:   Financial resources: Income from employment, Erin Lewis, [place] Does patient have a Programmer, applications or guardian?: No  Alcohol/Substance Abuse:   What has been your use of drugs/alcohol within the  last 12 months?: 1-2 beers on weekend; denies any drug use If  attempted suicide, did drugs/alcohol play a role in this?: No Alcohol/Substance Abuse Treatment Hx: Denies past history If yes, describe treatment: None reported Has alcohol/substance abuse ever caused legal problems?: No  Social Support System:   Heritage manager System: None Describe Community Support System: Pt says she has no friends or family she relies on and prefers to be alone Type of faith/religion: Methodist How does patient's faith help to cope with current illness?: Prayer  Leisure/Recreation:   Leisure and Hobbies: Walking  Strengths/Needs:   What is the patient's perception of their strengths?: "I mind my own business" Patient states they can use these personal strengths during their treatment to contribute to their recovery: "Minding my own business" Patient states these barriers may affect/interfere with their treatment: None reported Patient states these barriers may affect their return to the community: None reported Other important information patient would like considered in planning for their treatment: None reported  Discharge Plan:   Currently receiving community mental health services: No Patient states concerns and preferences for aftercare planning are: Pt states she would like to be referred for outpatient treatment Patient states they will know when they are safe and ready for discharge when: "I'm ready now, I don't need to be here" Does patient have access to transportation?: Yes Does patient have financial barriers related to discharge medications?: No Patient description of barriers related to discharge medications: None reported Will patient be returning to same living situation after discharge?: Yes  Summary/Recommendations:   Summary and Recommendations (to be completed by the evaluator): Pt is a 63 yr old female brought to the ED for attempted overdose. Pt denies the overdose  and says her son admitted her into the hospital because he was upset with her. According to the pt, her son has bipolar d/o and had gotten into an altercation with his wife, where he destroyed her property. The pt states she expressed to her son her disapproval of his behavior and as a result admitted her into the hospital. Pt states hx of depression and psychiatric hospitalization "many years ago." Pt denies any MH or SA affecting her job performance. Pt reports drinking 1-2 beers on weekends and denies any drug use. The pt reports she is separated from her husband and discussed infidelity in the marriage. Pt reports she receives income from employment and disability (rotator cuff surgery). Pt says she is open to receiving outpatient treatment and does not have a provider currently.  At discharge pt will return home and follow up with outpatient treatment. While here, patient will benefit from crisis stabilization, medication evaluation, group therapy and psychoeducation. In addition, it is recommended that patient remain compliant with the established discharge plan and continue treatment.   Clea Dubach T Masahiro Iglesia. 05/21/2018

## 2018-05-21 NOTE — [facility] Group Notes (Signed)
LCSW Group Therapy Note   05/21/2018 12:39 PM   Type of Therapy and Topic:  Group Therapy:  Overcoming Obstacles   Participation Level:  Did Not Attend   Description of Group:    In this group patients will be encouraged to explore what they see as obstacles to their own wellness and recovery. They will be guided to discuss their thoughts, feelings, and behaviors related to these obstacles. The group will process together ways to cope with barriers, with attention given to specific choices patients can make. Each patient will be challenged to identify changes they are motivated to make in order to overcome their obstacles. This group will be process-oriented, with patients participating in exploration of their own experiences as well as giving and receiving support and challenge from other group members.   Therapeutic Goals: 1. Patient will identify personal and current obstacles as they relate to admission. 2. Patient will identify barriers that currently interfere with their wellness or overcoming obstacles.  3. Patient will identify feelings, thought process and behaviors related to these barriers. 4. Patient will identify two changes they are willing to make to overcome these obstacles:      Summary of Patient Progress x     Therapeutic Modalities:   Cognitive Behavioral Therapy Solution Focused Therapy Motivational Interviewing Relapse Prevention Therapy  Evalina Field, MSW, LCSW Clinical Social Work 05/21/2018 12:39 PM

## 2018-05-21 NOTE — Progress Notes (Addendum)
D: Patient was admitted from [facility] after being IVC'd by her son for allegedly overdosing on Klonopin. The patient denies overdosing, denies being suicidal. She states that her son is bipolar and that he did this to her in retaliation for not taking his side in an argument he had with his wife. She states he is bipolar and that he got angry and destroyed his wife's property. Patient lives alone. 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 She denies substance abuse. Says she smokes 1/2 ppd and drinks 1-2 beers only on weekends. She says that when th police came to her house they found an empty bottle of Klonopin but insists she only takes one per night for sleep and denies abuse or any attempt at suicide ever. She does admit to having been hospitalized for mental health in the past but says it was a long time ago. Says she has not eaten in 8 days because she is on a diet and that she has not had any water or fluids for 4 days. She says she fell when she was on BHU but that was not passed on in report. Denies current SI, HI, AV hallucinations and withdrawal symptoms. Skin assessment was completed with Glenda, RN and no lesions or wounds were found. No contraband was found. Patient is tearful and upset about being here and is refusing to sign paperwork. A: Continue to monitor and offer support. R: Safety maintained

## 2018-05-21 NOTE — H&P (Signed)
Psychiatric Admission Assessment Adult  Patient Identification: Erin Lewis MRN:  [id number] Date of Evaluation:  05/21/2018 Chief Complaint:  Depression Principal Diagnosis: Recurrent major depression-severe ([place]) Diagnosis:  Principal Problem:   Recurrent major depression-severe ([place]) Active Problems:   HTN (hypertension)   Alcohol use disorder, moderate, dependence ([place])  History of Present Illness: Patient seen chart reviewed.  This is a patient who was brought in under IVC after her son notified authorities that the patient had overdosed on her medicine.  Patient was described as being unresponsive when EMS arrived.  Patient however has consistently denied any suicidal thought or intent and denied that she intentionally overdosed.  She said that she admitted that she drank a beer on the night in question and took her Klonopin as prescribed.  Patient admits that her life has been stressful recently but denies being consistently depressed.  Denies any suicidal ideation.  When she first was brought to the hospital she refused to eat or drink for several hours for reasons that remain unclear.  She is denying hallucinations or psychotic symptoms.  Not currently receiving any outpatient treatment.  Has split up apparently permanently with her husband which has been a major life stress for her. Associated Signs/Symptoms: Depression Symptoms:  depressed mood, psychomotor agitation, difficulty concentrating, (Hypo) Manic Symptoms:  Distractibility, Anxiety Symptoms:  Excessive Worry, Psychotic Symptoms:  Paranoia, PTSD Symptoms: Negative Total Time spent with patient: 1 hour  Past Psychiatric History: Patient has had previous admissions to the hospital for depression and previous suicide attempts by overdose.  Last time she was seen was some years ago.  She tells me she never thought that antidepressants were of any help to her.  Her insight about her alcohol problem has been limited but it  has been noted in the past.  Is the patient at risk to self? Yes.    Has the patient been a risk to self in the past 6 months? Yes.    Has the patient been a risk to self within the distant past? Yes.    Is the patient a risk to others? No.  Has the patient been a risk to others in the past 6 months? No.  Has the patient been a risk to others within the distant past? No.   Prior Inpatient Therapy:   Prior Outpatient Therapy:    Alcohol Screening: 1. How often do you have a drink containing alcohol?: 2 to 4 times a month 2. How many drinks containing alcohol do you have on a typical day when you are drinking?: 1 or 2 3. How often do you have six or more drinks on one occasion?: Never AUDIT-C Score: 2 4. How often during the last year have you found that you were not able to stop drinking once you had started?: Never 5. How often during the last year have you failed to do what was normally expected from you becasue of drinking?: Never 6. How often during the last year have you needed a first drink in the morning to get yourself going after a heavy drinking session?: Never 7. How often during the last year have you had a feeling of guilt of remorse after drinking?: Never 8. How often during the last year have you been unable to remember what happened the night before because you had been drinking?: Never 9. Have you or someone else been injured as a result of your drinking?: No 10. Has a relative or friend or a doctor or another health  worker been concerned about your drinking or suggested you cut down?: No Alcohol Use Disorder Identification Test Final Score (AUDIT): 2 Alcohol Brief Interventions/Follow-up: Brief Advice Substance Abuse History in the last 12 months:  Yes.   Consequences of Substance Abuse: Medical Consequences:  Current overdose Previous Psychotropic Medications: Yes  Psychological Evaluations: Yes  Past Medical History:  Past Medical History:  Diagnosis Date  .  Hyperlipidemia   . Hypertension     Past Surgical History:  Procedure Laterality Date  . [facility]  . CESAREAN SECTION     x2  . COLONOSCOPY WITH PROPOFOL N/A 07/20/2016   Procedure: COLONOSCOPY WITH PROPOFOL;  Surgeon: Robert Bellow, MD;  Location: [facility] ENDOSCOPY;  Service: Endoscopy;  Laterality: N/A;  . laproscopy     abdomen  . SHOULDER ARTHROSCOPY W/ ROTATOR CUFF REPAIR     x2  . TOTAL SHOULDER ARTHROPLASTY Right 6-7 years ago   Family History:  Family History  Problem Relation Age of Onset  . Hypertension Other   . Diabetes Other   . Colon polyps Father 52  . Breast cancer Neg Hx    Family Psychiatric  History: Patient claims that her son has bipolar disorder and blames the situation on him. Tobacco Screening: Have you used any form of tobacco in the last 30 days? (Cigarettes, Smokeless Tobacco, Cigars, and/or Pipes): Yes Tobacco use, Select all that apply: 5 or more cigarettes per day Are you interested in Tobacco Cessation Medications?: Yes, will notify MD for an order Counseled patient on smoking cessation including recognizing danger situations, developing coping skills and basic information about quitting provided: Yes Social History:  Social History   Substance and Sexual Activity  Alcohol Use Yes  . Frequency: Never   Comment: occasionally     Social History   Substance and Sexual Activity  Drug Use No    Additional Social History: Marital status: Separated Separated, when?: Pt says she is separated from her husband, married for 88 yrs What types of issues is patient dealing with in the relationship?: Infidelity Additional relationship information: None reported Are you sexually active?: No What is your sexual orientation?: Heterosexual Has your sexual activity been affected by drugs, alcohol, medication, or emotional stress?: None reported Does patient have children?: Yes How many children?: 2 How is patient's  relationship with their children?: Pt reports having a good relationship with her oldest son([name]); "volatile relationship with [name]"    Pain Medications: KLONOPIN History of alcohol / drug use?: Yes Longest period of sobriety (when/how long): UNKOWN Negative Consequences of Use: Personal relationships Withdrawal Symptoms: Nausea / Vomiting Name of Substance 1: KLONOPIN 1 - Age of First Use: UNKNOWN 1 - Amount (size/oz): UNKNOWN 1 - Frequency: UNKNOWN 1 - Duration: UNKNOWN                  Allergies:  No Known Allergies Lab Results:  Results for orders placed or performed during the hospital encounter of 05/20/18 (from the past 48 hour(s))  Hemoglobin A1c     Status: None   Collection Time: 05/21/18  6:48 AM  Result Value Ref Range   Hgb A1c MFr Bld 5.4 4.8 - 5.6 %    Comment: (NOTE) Pre diabetes:          5.7%-6.4% Diabetes:              >6.4% Glycemic control for   <7.0% adults with diabetes    Mean Plasma Glucose 108.28  mg/dL    Comment: Performed at [facility], [place] [address], [place], [place] [address]  Lipid panel     Status: Abnormal   Collection Time: 05/21/18  6:48 AM  Result Value Ref Range   Cholesterol 199 0 - 200 mg/dL   Triglycerides 212 (H) <150 mg/dL   HDL 84 >40 mg/dL   Total CHOL/HDL Ratio 2.4 RATIO   VLDL 42 (H) 0 - 40 mg/dL   LDL Cholesterol 73 0 - 99 mg/dL    Comment:        Total Cholesterol/HDL:CHD Risk Coronary Heart Disease Risk Table                     Men   Women  1/2 Average Risk   3.4   3.3  Average Risk       5.0   4.4  2 X Average Risk   9.6   7.1  3 X Average Risk  23.4   11.0        Use the calculated Patient Ratio above and the CHD Risk Table to determine the patient's CHD Risk.        ATP III CLASSIFICATION (LDL):  <100     mg/dL   Optimal  100-129  mg/dL   Near or Above                    Optimal  130-159  mg/dL   Borderline  160-189  mg/dL   High  >190     mg/dL   Very High Performed at [facility], [address] [place], [place] [address]   TSH     Status: Abnormal   Collection Time: 05/21/18  6:48 AM  Result Value Ref Range   TSH 5.492 (H) 0.350 - 4.500 uIU/mL    Comment: Performed by a 3rd Generation assay with a functional sensitivity of <=0.01 uIU/mL. Performed at [facility], [place] [place],  [address]     Blood Alcohol level:  No results found for: [facility]  Metabolic Disorder Labs:  Lab Results  Component Value Date   HGBA1C 5.4 05/21/2018   MPG 108.28 05/21/2018   No results found for: PROLACTIN Lab Results  Component Value Date   CHOL 199 05/21/2018   TRIG 212 (H) 05/21/2018   HDL 84 05/21/2018   CHOLHDL 2.4 05/21/2018   VLDL 42 (H) 05/21/2018   LDLCALC 73 05/21/2018   LDLCALC 158 (H) 12/22/2012    Current Medications: Current Facility-Administered Medications  Medication Dose Route Frequency Provider Last Rate Last Dose  . acetaminophen (TYLENOL) tablet 650 mg  650 mg Oral Q6H PRN Patience Musca, MD   650 mg at 05/21/18 [phone]  . alum & mag hydroxide-simeth (MAALOX/MYLANTA) 200-200-20 MG/5ML suspension 30 mL  30 mL Oral Q4H PRN Patience Musca, MD      . cholecalciferol (VITAMIN D3) tablet 1,000 Units  1,000 Units Oral Daily Patience Musca, MD   1,000 Units at 05/21/18 [phone]  . hydrochlorothiazide (HYDRODIURIL) tablet 25 mg  25 mg Oral Daily Abran Richard C, MD   25 mg at 05/21/18 0824  . hydrOXYzine (ATARAX/VISTARIL) tablet 25 mg  25 mg Oral Q4H PRN Patience Musca, MD   25 mg at 05/21/18 8938  . loperamide (IMODIUM) capsule 2-4 mg  2-4 mg Oral PRN Patience Musca, MD      . losartan (COZAAR) tablet 100 mg  100 mg Oral  Daily Patience Musca, MD   100 mg at 05/21/18 0825  . magnesium hydroxide (MILK OF MAGNESIA) suspension 30 mL  30 mL Oral Daily PRN Patience Musca, MD      . multivitamin with minerals tablet 1 tablet  1 tablet Oral Daily Patience Musca, MD   1 tablet at 05/21/18  [phone]  . nicotine (NICODERM CQ - dosed in mg/24 hours) patch 21 mg  21 mg Transdermal Daily Lucillia Corson, Madie Reno, MD   21 mg at 05/21/18 0608  . ondansetron (ZOFRAN-ODT) disintegrating tablet 4 mg  4 mg Oral Q6H PRN Patience Musca, MD      . protein supplement (ENSURE MAX) liquid  11 oz Oral BID Damiean Lukes, Madie Reno, MD   11 oz at 05/21/18 1839  . thiamine (VITAMIN B-1) tablet 100 mg  100 mg Oral Daily Patience Musca, MD   100 mg at 05/21/18 5366  . traZODone (DESYREL) tablet 100 mg  100 mg Oral QHS PRN Patience Musca, MD      . vitamin B-12 (CYANOCOBALAMIN) tablet 1,000 mcg  1,000 mcg Oral Daily Patience Musca, MD   1,000 mcg at 05/21/18 4403   PTA Medications: Facility-Administered Medications Prior to Admission  Medication Dose Route Frequency Provider Last Rate Last Dose  . clindamycin (CLEOCIN) 600 mg in dextrose 5 % 50 mL IVPB  600 mg Intravenous Once Robert Bellow, MD       Medications Prior to Admission  Medication Sig Dispense Refill Last Dose  . aspirin EC 81 MG tablet Take 81 mg by mouth daily.   unknown at unknown  . atorvastatin (LIPITOR) 20 MG tablet Take 20 mg by mouth daily.   unknown at unknown  . clonazePAM (KLONOPIN) 0.5 MG tablet Take 0.5 mg by mouth 2 (two) times daily.    unknown at unknown  . ergocalciferol (VITAMIN D2) 50000 units capsule Take 50,000 Units by mouth once a week.   unknown at unknown  . hydrOXYzine (ATARAX/VISTARIL) 25 MG tablet Take 25 mg by mouth at bedtime.    unknown at unknown  . losartan-hydrochlorothiazide (HYZAAR) 100-25 MG tablet Take 1 tablet by mouth daily.   unknown at unknown    Musculoskeletal: Strength & Muscle Tone: within normal limits Gait & Station: normal Patient leans: N/A  Psychiatric Specialty Exam: Physical Exam  Nursing note and vitals reviewed. Constitutional: She appears well-developed and well-nourished.  HENT:  Head: Normocephalic and atraumatic.  Eyes: Pupils are equal, round, and reactive to light.  Conjunctivae are normal.  Neck: Normal range of motion.  Cardiovascular: Regular rhythm and normal heart sounds.  Respiratory: Effort normal.  GI: Soft.  Musculoskeletal: Normal range of motion.  Neurological: She is alert.  Skin: Skin is warm and dry.  Psychiatric: Her affect is labile. Her speech is rapid and/or pressured. She is agitated. She is not aggressive. Thought content is paranoid. She expresses impulsivity. She expresses no homicidal and no suicidal ideation. She exhibits abnormal recent memory.    Review of Systems  Constitutional: Negative.   HENT: Negative.   Eyes: Negative.   Respiratory: Negative.   Cardiovascular: Negative.   Gastrointestinal: Negative.   Musculoskeletal: Negative.   Skin: Negative.   Neurological: Negative.   Psychiatric/Behavioral: Positive for substance abuse. Negative for suicidal ideas.    Blood pressure 105/65, pulse 76, temperature 98.5 F (36.9 C), temperature source Oral, resp. rate 18, height 5\' 5"  (1.651 m), weight 72.6 kg, SpO2 100 %.Body mass index is 26.63  kg/m.  General Appearance: Casual  Eye Contact:  Fair  Speech:  Pressured  Volume:  Increased  Mood:  Irritable  Affect:  Inappropriate  Thought Process:  Disorganized  Orientation:  Full (Time, Place, and Person)  Thought Content:  Rumination  Suicidal Thoughts:  No  Homicidal Thoughts:  No  Memory:  Immediate;   Fair Recent;   Fair Remote;   Fair  Judgement:  Impaired  Insight:  Shallow  Psychomotor Activity:  Restlessness  Concentration:  Concentration: Poor  Recall:  Poor  Fund of Knowledge:  Fair  Language:  Fair  Akathisia:  No  Handed:  Right  AIMS (if indicated):     Assets:  Desire for Improvement Housing Physical Health Social Support  ADL's:  Impaired  Cognition:  Impaired,  Mild  Sleep:  Number of Hours: 4    Treatment Plan Summary: Daily contact with patient to assess and evaluate symptoms and progress in treatment, Medication management and  Plan Somewhat unclear presentation.  The patient completely denies suicide attempt or suicidal ideation but she is really inappropriately angry and labile today.  Not sleeping.  Clearly seems to be distressed somehow.  Refusing medicine and showing some odd behavior.  I am not comfortable with immediate discharge which is what the patient is demanding.  Informed the patient that we will reassess how she is doing tomorrow.  This did not placate her and she became extremely agitated and somewhat disruptive on the unit.  Observation Level/Precautions:  15 minute checks  Laboratory:  Chemistry Profile  Psychotherapy:    Medications:    Consultations:    Discharge Concerns:    Estimated LOS:  Other:     Physician Treatment Plan for Primary Diagnosis: Recurrent major depression-severe ([place]) Long Term Goal(s): Improvement in symptoms so as ready for discharge  Short Term Goals: Ability to verbalize feelings will improve and Ability to demonstrate self-control will improve  Physician Treatment Plan for Secondary Diagnosis: Principal Problem:   Recurrent major depression-severe ([place]) Active Problems:   HTN (hypertension)   Alcohol use disorder, moderate, dependence ([place])  Long Term Goal(s): Improvement in symptoms so as ready for discharge  Short Term Goals: Ability to identify triggers associated with substance abuse/mental health issues will improve  I certify that inpatient services furnished can reasonably be expected to improve the patient's condition.    Alethia Berthold, MD 3/16/20206:46 PM

## 2018-05-21 NOTE — Progress Notes (Signed)
Recreation Therapy Notes    Date: 05/21/2018  Time: 9:30 am  Location: Craft Room  Behavioral response: Appropriate  Intervention Topic: Anger Management  Discussion/Intervention:  Group content on today was focused on anger management. The group defined anger and reasons they become angry. Individuals expressed negative way they have dealt with anger in the past. Patients stated some positive ways they could deal with anger in the future. The group described how anger can affect your health and daily plans. Individuals participated in the intervention "Score your anger" where they had a chance to answer questions about themselves and get a score of their anger.  Clinical Observations/Feedback:  Patient came to group late due to unknown reasons. She participated in the intervention during group. Azlee Monforte LRT/CTRS          Taevyn Hausen 05/21/2018 10:36 AM

## 2018-05-21 NOTE — Tx Team (Addendum)
Interdisciplinary Treatment and Diagnostic Plan Update  05/21/2018 Time of Session: 230pm Erin Lewis MRN: [id number]  Principal Diagnosis: <principal problem not specified>  Secondary Diagnoses: Active Problems:   Recurrent major depression-severe (HCC)   Current Medications:  Current Facility-Administered Medications  Medication Dose Route Frequency Provider Last Rate Last Dose  . acetaminophen (TYLENOL) tablet 650 mg  650 mg Oral Q6H PRN Patience Musca, MD   650 mg at 05/21/18 0626  . alum & mag hydroxide-simeth (MAALOX/MYLANTA) 200-200-20 MG/5ML suspension 30 mL  30 mL Oral Q4H PRN Patience Musca, MD      . cholecalciferol (VITAMIN D3) tablet 1,000 Units  1,000 Units Oral Daily Patience Musca, MD   1,000 Units at 05/21/18 [phone]  . hydrochlorothiazide (HYDRODIURIL) tablet 25 mg  25 mg Oral Daily Abran Richard C, MD   25 mg at 05/21/18 0824  . hydrOXYzine (ATARAX/VISTARIL) tablet 25 mg  25 mg Oral Q4H PRN Patience Musca, MD   25 mg at 05/21/18 4627  . loperamide (IMODIUM) capsule 2-4 mg  2-4 mg Oral PRN Patience Musca, MD      . losartan (COZAAR) tablet 100 mg  100 mg Oral Daily Patience Musca, MD   100 mg at 05/21/18 0825  . magnesium hydroxide (MILK OF MAGNESIA) suspension 30 mL  30 mL Oral Daily PRN Patience Musca, MD      . multivitamin with minerals tablet 1 tablet  1 tablet Oral Daily Patience Musca, MD   1 tablet at 05/21/18 [phone]  . nicotine (NICODERM CQ - dosed in mg/24 hours) patch 21 mg  21 mg Transdermal Daily Clapacs, Madie Reno, MD   21 mg at 05/21/18 0608  . ondansetron (ZOFRAN-ODT) disintegrating tablet 4 mg  4 mg Oral Q6H PRN Patience Musca, MD      . protein supplement (ENSURE MAX) liquid  11 oz Oral BID Clapacs, John T, MD      . thiamine (VITAMIN B-1) tablet 100 mg  100 mg Oral Daily Patience Musca, MD   100 mg at 05/21/18 0938  . traZODone (DESYREL) tablet 100 mg  100 mg Oral QHS PRN Patience Musca, MD      .  vitamin B-12 (CYANOCOBALAMIN) tablet 1,000 mcg  1,000 mcg Oral Daily Patience Musca, MD   1,000 mcg at 05/21/18 1829   PTA Medications: Facility-Administered Medications Prior to Admission  Medication Dose Route Frequency Provider Last Rate Last Dose  . clindamycin (CLEOCIN) 600 mg in dextrose 5 % 50 mL IVPB  600 mg Intravenous Once Robert Bellow, MD       Medications Prior to Admission  Medication Sig Dispense Refill Last Dose  . aspirin EC 81 MG tablet Take 81 mg by mouth daily.   unknown at unknown  . atorvastatin (LIPITOR) 20 MG tablet Take 20 mg by mouth daily.   unknown at unknown  . clonazePAM (KLONOPIN) 0.5 MG tablet Take 0.5 mg by mouth 2 (two) times daily.    unknown at unknown  . ergocalciferol (VITAMIN D2) 50000 units capsule Take 50,000 Units by mouth once a week.   unknown at unknown  . hydrOXYzine (ATARAX/VISTARIL) 25 MG tablet Take 25 mg by mouth at bedtime.    unknown at unknown  . losartan-hydrochlorothiazide (HYZAAR) 100-25 MG tablet Take 1 tablet by mouth daily.   unknown at unknown    Patient Stressors: Marital or family conflict  Patient Strengths: Ability for insight Average or above average  intelligence Capable of independent living Communication skills General fund of knowledge Religious Affiliation  Treatment Modalities: Medication Management, Group therapy, Case management,  1 to 1 session with clinician, Psychoeducation, Recreational therapy.   Physician Treatment Plan for Primary Diagnosis: <principal problem not specified> Long Term Goal(s):     Short Term Goals:    Medication Management: Evaluate patient's response, side effects, and tolerance of medication regimen.  Therapeutic Interventions: 1 to 1 sessions, Unit Group sessions and Medication administration.  Evaluation of Outcomes: Progressing  Physician Treatment Plan for Secondary Diagnosis: Active Problems:   Recurrent major depression-severe ([place])  Long Term Goal(s):      Short Term Goals:       Medication Management: Evaluate patient's response, side effects, and tolerance of medication regimen.  Therapeutic Interventions: 1 to 1 sessions, Unit Group sessions and Medication administration.  Evaluation of Outcomes: Progressing   RN Treatment Plan for Primary Diagnosis: <principal problem not specified> Long Term Goal(s): Knowledge of disease and therapeutic regimen to maintain health will improve  Short Term Goals: Ability to verbalize frustration and anger appropriately will improve, Ability to verbalize feelings will improve, Ability to disclose and discuss suicidal ideas, Ability to identify and develop effective coping behaviors will improve and Compliance with prescribed medications will improve  Medication Management: RN will administer medications as ordered by provider, will assess and evaluate patient's response and provide education to patient for prescribed medication. RN will report any adverse and/or side effects to prescribing provider.  Therapeutic Interventions: 1 on 1 counseling sessions, Psychoeducation, Medication administration, Evaluate responses to treatment, Monitor vital signs and CBGs as ordered, Perform/monitor CIWA, COWS, AIMS and Fall Risk screenings as ordered, Perform wound care treatments as ordered.  Evaluation of Outcomes: Progressing   LCSW Treatment Plan for Primary Diagnosis: <principal problem not specified> Long Term Goal(s): Safe transition to appropriate next level of care at discharge, Engage patient in therapeutic group addressing interpersonal concerns.  Short Term Goals: Engage patient in aftercare planning with referrals and resources  Therapeutic Interventions: Assess for all discharge needs, 1 to 1 time with Social worker, Explore available resources and support systems, Assess for adequacy in community support network, Educate family and significant other(s) on suicide prevention, Complete Psychosocial  Assessment, Interpersonal group therapy.  Evaluation of Outcomes: Progressing   Progress in Treatment: Attending groups: No. Participating in groups: No. Taking medication as prescribed: Yes. Toleration medication: Yes. Family/Significant other contact made: No, will contact:  pt declined Patient understands diagnosis: Yes. Discussing patient identified problems/goals with staff: Yes. Medical problems stabilized or resolved: No. Denies suicidal/homicidal ideation: Yes. Issues/concerns per patient self-inventory: No. Other: NA  New problem(s) identified: No, Describe:  none reported  New Short Term/Long Term Goal(s): "I didn't ask to be here"  Patient Goals:  "I didn't ask to be here"  Discharge Plan or Barriers: Pt will return home and follow up with outpatient treatment  Reason for Continuation of Hospitalization: Aggression Medication stabilization Pt refused to sign signature page but did come to treatment team meeting. Pt became agitated and demanding to be discharged from the hospital.  Estimated Length of Stay: Discharge on 05/22/2018  Recreational Therapy: Patient Stressors: N/A Patient Goal: Patient will engage in groups without prompting or encouragement from LRT x3 group sessions within 5 recreation therapy group sessions  Attendees: Patient:Erin Lewis 05/21/2018 4:21 PM  Physician: Alethia Berthold 05/21/2018 4:21 PM  Nursing:  05/21/2018 4:21 PM  RN Care Manager: 05/21/2018 4:21 PM  Social Worker: [place] Olivia Moton  Ethel Rana 05/21/2018 4:21 PM  Recreational Therapist: Roanna Epley 05/21/2018 4:21 PM  Other:  05/21/2018 4:21 PM  Other:  05/21/2018 4:21 PM  Other: 05/21/2018 4:21 PM    Scribe for Treatment Team: Yvette Rack, LCSW 05/21/2018 4:21 PM

## 2018-05-21 NOTE — Progress Notes (Signed)
Initial Nutrition Assessment  DOCUMENTATION CODES:   Not applicable  INTERVENTION:   Ensure Max protein supplement BID, each supplement provides 150kcal and 30g of protein.  MVI daily   NUTRITION DIAGNOSIS:   Inadequate oral intake related to social / environmental circumstances as evidenced by per patient/family report.  GOAL:   Patient will meet greater than or equal to 90% of their needs  MONITOR:   PO intake, Supplement acceptance  REASON FOR ASSESSMENT:   Malnutrition Screening Tool    ASSESSMENT:    63 y.o. female patient brought in by EMS due to possible overdose.    Per chart review, pt with inadequate oral intake pta; pt reports not eating for the past 8 days as she is trying to loose weight. Pt refusing meals in hospital as well. Per chart, pt appears weight stable pta. RD will add supplements to help pt meet her estimated needs. Will order Ensure Max Protein as this is low in fat and calories and patient may be more accepting of drinking this. Recommend continue MVI and thiamine.   Medications reviewed and include: vitamin D, MVI, nicotine, thiamine, B12  Labs reviewed: Na 134(L), BUN 24(H), creat 1.10(H)- 3/14  Diet Order:   Diet Order            Diet regular Room service appropriate? Yes; Fluid consistency: Thin  Diet effective now             EDUCATION NEEDS:   Not appropriate for education at this time  Skin:  Skin Assessment: Reviewed RN Assessment  Last BM:  unknown   Height:   Ht Readings from Last 1 Encounters:  05/20/18 5\' 5"  (1.651 m)    Weight:   Wt Readings from Last 1 Encounters:  05/20/18 72.6 kg    Ideal Body Weight:  56.8 kg  BMI:  Body mass index is 26.63 kg/m.  Estimated Nutritional Needs:   Kcal:  1500-1800kcal/day   Protein:  73-80g/day   Fluid:  1.7L/day   Koleen Distance MS, RD, LDN Pager #- [phone] Office#- [phone] After Hours Pager: [phone]

## 2018-05-21 NOTE — Tx Team (Signed)
Initial Treatment Plan 05/21/2018 12:51 AM Erin Lewis YSH:[id number]    PATIENT STRESSORS: Marital or family conflict   PATIENT STRENGTHS: Ability for insight Average or above average intelligence Capable of independent living Communication skills General fund of knowledge Religious Affiliation   PATIENT IDENTIFIED PROBLEMS: depression                     DISCHARGE CRITERIA:  Ability to meet basic life and health needs Adequate post-discharge living arrangements Improved stabilization in mood, thinking, and/or behavior Medical problems require only outpatient monitoring Motivation to continue treatment in a less acute level of care Need for constant or close observation no longer present Reduction of life-threatening or endangering symptoms to within safe limits Safe-care adequate arrangements made Verbal commitment to aftercare and medication compliance Withdrawal symptoms are absent or subacute and managed without 24-hour nursing intervention  PRELIMINARY DISCHARGE PLAN: Outpatient therapy Return to previous living arrangement  PATIENT/FAMILY INVOLVEMENT: This treatment plan has been presented to and reviewed with the patient, Erin Lewis, and/or family member.  The patient and family have been given the opportunity to ask questions and make suggestions.  Libby Maw, RN 05/21/2018, 12:51 AM

## 2018-05-22 MED ORDER — VITAMIN D 25 MCG (1000 UNIT) PO TABS: 1000.0000 [IU] | ORAL_TABLET | Freq: Every day | ORAL | 1 refills | Status: AC

## 2018-05-22 MED ORDER — TRAZODONE HCL 100 MG PO TABS: 100.0000 mg | ORAL_TABLET | Freq: Every evening | ORAL | 1 refills | Status: DC | PRN

## 2018-05-22 MED ORDER — CYANOCOBALAMIN 1000 MCG PO TABS: 1000.0000 ug | ORAL_TABLET | Freq: Every day | ORAL | 1 refills | Status: DC

## 2018-05-22 NOTE — Progress Notes (Signed)
MD made aware of orders that have not been completed (EKG and urine testings). MD approval granted that patient can discharge without these being completed. Will continue to monitor and process patient as orders come in for discharge.

## 2018-05-22 NOTE — BHH Suicide Risk Assessment (Signed)
[facility] Discharge Suicide Risk Assessment   Principal Problem: Recurrent major depression-severe [facility]) Discharge Diagnoses: Principal Problem:   Recurrent major depression-severe ([place]) Active Problems:   HTN (hypertension)   Alcohol use disorder, moderate, dependence ([place])   Total Time spent with patient: 45 minutes  Musculoskeletal: Strength & Muscle Tone: within normal limits Gait & Station: normal Patient leans: N/A  Psychiatric Specialty Exam: Review of Systems  Constitutional: Negative.   HENT: Negative.   Eyes: Negative.   Respiratory: Negative.   Cardiovascular: Negative.   Gastrointestinal: Negative.   Musculoskeletal: Negative.   Skin: Negative.   Neurological: Negative.   Psychiatric/Behavioral: Negative for depression, hallucinations, memory loss, substance abuse and suicidal ideas. The patient is nervous/anxious. The patient does not have insomnia.     Blood pressure (!) 93/59, pulse 60, temperature (!) 97.5 F (36.4 C), temperature source Oral, resp. rate 18, height 5\' 5"  (1.651 m), weight 72.6 kg, SpO2 100 %.Body mass index is 26.63 kg/m.  General Appearance: Casual  Eye Contact::  Fair  Speech:  Slow409  Volume:  Decreased  Mood:  Euthymic  Affect:  Constricted  Thought Process:  Goal Directed  Orientation:  Full (Time, Place, and Person)  Thought Content:  Logical  Suicidal Thoughts:  No  Homicidal Thoughts:  No  Memory:  Immediate;   Fair Recent;   Fair Remote;   Fair  Judgement:  Fair  Insight:  Fair  Psychomotor Activity:  Normal  Concentration:  Fair  Recall:  [employer] of Knowledge:Fair  Language: Fair  Akathisia:  No  Handed:  Right  AIMS (if indicated):     Assets:  Communication Skills Housing Resilience Social Support  Sleep:  Number of Hours: 7.5  Cognition: WNL  ADL's:  Intact   Mental Status Per Nursing Assessment::   On Admission:  Suicidal ideation indicated by others  Demographic Factors:  Divorced or widowed and Living  alone  Loss Factors: Loss of significant relationship  Historical Factors: Prior suicide attempts  Risk Reduction Factors:   Religious beliefs about death and Positive social support  Continued Clinical Symptoms:  Depression:   Anhedonia Alcohol/Substance Abuse/Dependencies  Cognitive Features That Contribute To Risk:  Closed-mindedness    Suicide Risk:  Minimal: No identifiable suicidal ideation.  Patients presenting with no risk factors but with morbid ruminations; may be classified as minimal risk based on the severity of the depressive symptoms  Follow-up Information    [place] Follow up on 05/28/2018.   Why:  Please follow up with [facility] on Friday, 05/28/18 at 2:30. Please take all hospital discharge paperwork with you to your appointment.  Thank you. Contact information: [place] [address] [phone]           Plan Of Care/Follow-up recommendations:  Activity:  Activity as tolerated Diet:  Regular diet.  Patient appears to be somewhat dehydrated and not eating very well.  Encouraged her to make sure she is staying well-hydrated and eating normally and does not continue to lose weight Other:  Patient is discharged with psychoeducation and encouragement to limit or discontinue alcohol abuse.  She is encouraged to follow-up with local mental health especially at [facility] if symptoms persist or if she reconsiders the presence of depressive symptoms.  Alethia Berthold, MD 05/22/2018, 10:45 AM

## 2018-05-22 NOTE — Progress Notes (Signed)
Recreation Therapy Notes  Date: 05/22/2018  Time: 9:30 am  Location: Craft Room  Behavioral response: Appropriate  Intervention Topic: Time Management  Discussion/Intervention:  Group content today was focused on time management. The group defined time management and identified healthy ways to manage time. Individuals expressed how much of the 24 hours they use in a day. Patients expressed how much time they use just for themselves personally. The group expressed how they have managed their time in the past. Individuals participated in the intervention "Managing Life" where they had a chance to see how much of the 24 hours they use and where it goes. Clinical Observations/Feedback:  Patient came to group and defined time management as sleeping until its time to work. She explained that her time management skills area 9.5/10 expressing there is room for improvement. Participant stated she uses six hours out of her twenty four hour day. Individual participated in the intervention during group and was social with peers and staff. Venesa Semidey LRT/CTRS           Iniya Matzek 05/22/2018 11:11 AM

## 2018-05-22 NOTE — Progress Notes (Signed)
  [facility] Adult Case Management Discharge Plan :  Will you be returning to the same living situation after discharge:  Yes,  pt lives alone At discharge, do you have transportation home?: Yes,  pt's daughter in law will provide transportation Do you have the ability to pay for your medications: Yes,  insurance  Release of information consent forms completed and in the chart;  Patient's signature needed at discharge.  Patient to Follow up at: Follow-up Information    [place] Follow up on 05/28/2018.   Why:  Please follow up with [facility] on Friday, 05/28/18 at 2:30. Please take all hospital discharge paperwork with you to your appointment.  Thank you. Contact information: [place] [address] [phone]           Next level of care provider has access to [place] and Suicide Prevention discussed: Yes,  with pt; pt declined family contact  Have you used any form of tobacco in the last 30 days? (Cigarettes, Smokeless Tobacco, Cigars, and/or Pipes): Yes  Has patient been referred to the Quitline?: N/A patient is not a smoker  Patient has been referred for addiction treatment: N/A  Yvette Rack, LCSW 05/22/2018, 10:52 AM

## 2018-05-22 NOTE — Progress Notes (Addendum)
D: Pt during assessments denies SI/HI/AVH, able to contract for safety. Pt is pleasant and cooperative, engages good. Pt. Eats a good breakfast. Pt. Endorses a normal mood.   A: Q x 15 minute observation checks to be completed for safety. Patient was provided with education.  Patient was given/offered medications per orders. Patient  was encourage to attend groups, participate in unit activities and continue with plan of care. Pt. Chart and plans of care reviewed. Pt. Given support and encouragement.   R: Patient is complaint with medications and unit procedures. Pt. Given discharge education as information comes in. Pt. Monitored per MD orders.

## 2018-05-22 NOTE — Plan of Care (Signed)
Pt. Is complaint with medications and treatment. Pt. Endorses a normal mood. Pt. Engages appropriately with staff and peers. Pt. Denies si/hi/avh, can contract for safety.    Problem: Education: Goal: Emotional status will improve Outcome: Progressing Goal: Mental status will improve Outcome: Progressing   Problem: Health Behavior/Discharge Planning: Goal: Compliance with treatment plan for underlying cause of condition will improve Outcome: Progressing   Problem: Safety: Goal: Periods of time without injury will increase Outcome: Progressing   Problem: Safety: Goal: Ability to disclose and discuss suicidal ideas will improve Outcome: Progressing

## 2018-05-22 NOTE — Progress Notes (Signed)
Patient given Gatorade and encouraged to hydrate, due to low blood pressure this morning. Will continue to monitor for safety.

## 2018-05-22 NOTE — Progress Notes (Signed)
Recreation Therapy Notes  INPATIENT RECREATION TR PLAN  Patient Details Name: Erin Lewis MRN: [id number] DOB: [birth year] Today's Date: 05/22/2018  Rec Therapy Plan Is patient appropriate for Therapeutic Recreation?: Yes Treatment times per week: At least 3 Estimated Length of Stay: 5-7 days TR Treatment/Interventions: Group participation (Comment)  Discharge Criteria Pt will be discharged from therapy if:: Discharged Treatment plan/goals/alternatives discussed and agreed upon by:: Patient/family  Discharge Summary Short term goals set: Patient will engage in groups without prompting or encouragement from LRT x3 group sessions within 5 recreation therapy group sessions Short term goals met: Complete Progress toward goals comments: Groups attended Which groups?: Other (Comment), Anger management(Time Management) Reason goals not met: N/A Therapeutic equipment acquired: N/A Reason patient discharged from therapy: Discharge from hospital Pt/family agrees with progress & goals achieved: Yes Date patient discharged from therapy: 05/22/18   [name] 05/22/2018, 11:43 AM

## 2018-05-22 NOTE — Progress Notes (Signed)
D:Patient denies SI/HI/AVH, able to contract for safety at this time. Pt appears calm and cooperative, and no distress noted. Pt. Continues to endorse a normal mood.   A: All Personal items in locker returned to pt. Pt. Given extensive discharge education.   R:  Pt States she will comply with outpatient/discharge services put into place, and take MEDS as prescribed. Pt escorted out of the building by staff for safety.

## 2018-05-22 NOTE — Progress Notes (Signed)
Patient alert and oriented x 4, denies SI/HI/AVH affect is flat, but she brightens upon approach, thoughts are organized, patient appears needy, intrusive with multiple request at once, and she was sometimes redirected. Patient is receptive to staff, complaint with evening medication regimen and she even attended evening wrap up group. 15 minutes safety checks maintained will continue to monitor.

## 2018-05-22 NOTE — Discharge Summary (Signed)
Physician Discharge Summary Note  Patient:  Erin Lewis is an 63 y.o., female MRN:  [id number] DOB:  1955/07/25 Patient phone:  [phone] (home)  Patient address:   [address] [place] [address],  Total Time spent with patient: 45 minutes  Date of Admission:  05/20/2018 Date of Discharge: May 22, 2018  Reason for Admission: Patient was admitted because of presentation at the emergency room under petition for involuntary commitment filed by her son who alleged that the patient had overdosed on medication  Principal Problem: Recurrent major depression-severe [facility]) Discharge Diagnoses: Principal Problem:   Recurrent major depression-severe ([place]) Active Problems:   HTN (hypertension)   Alcohol use disorder, moderate, dependence ([place])   Past Psychiatric History: Patient has a past history of depression and a past history of suicide attempts most recently several years ago.  More recently had been noncompliant with treatment.  Major life stresses including the dissolution of her marriage and loneliness.  Past Medical History:  Past Medical History:  Diagnosis Date  . Hyperlipidemia   . Hypertension     Past Surgical History:  Procedure Laterality Date  . [facility]  . CESAREAN SECTION     x2  . COLONOSCOPY WITH PROPOFOL N/A 07/20/2016   Procedure: COLONOSCOPY WITH PROPOFOL;  Surgeon: Robert Bellow, MD;  Location: [facility] ENDOSCOPY;  Service: Endoscopy;  Laterality: N/A;  . laproscopy     abdomen  . SHOULDER ARTHROSCOPY W/ ROTATOR CUFF REPAIR     x2  . TOTAL SHOULDER ARTHROPLASTY Right 6-7 years ago   Family History:  Family History  Problem Relation Age of Onset  . Hypertension Other   . Diabetes Other   . Colon polyps Father 8  . Breast cancer Neg Hx    Family Psychiatric  History: Denies any other than to say that her son is "bipolar" Social History:  Social History   Substance and Sexual Activity  Alcohol Use Yes  .  Frequency: Never   Comment: occasionally     Social History   Substance and Sexual Activity  Drug Use No    Social History   Socioeconomic History  . Marital status: Married    Spouse name: Not on file  . Number of children: Not on file  . Years of education: Not on file  . Highest education level: Not on file  Occupational History  . Not on file  Social Needs  . Financial resource strain: Not on file  . Food insecurity:    Worry: Not on file    Inability: Not on file  . Transportation needs:    Medical: Not on file    Non-medical: Not on file  Tobacco Use  . Smoking status: Current Every Day Smoker    Packs/day: 0.50    Types: Cigarettes  . Smokeless tobacco: Never Used  Substance and Sexual Activity  . Alcohol use: Yes    Frequency: Never    Comment: occasionally  . Drug use: No  . Sexual activity: Not on file  Lifestyle  . Physical activity:    Days per week: Not on file    Minutes per session: Not on file  . Stress: Not on file  Relationships  . Social connections:    Talks on phone: Not on file    Gets together: Not on file    Attends religious service: Not on file    Active member of club or organization: Not on file  Attends meetings of clubs or organizations: Not on file    Relationship status: Not on file  Other Topics Concern  . Not on file  Social History Narrative  . Not on file    Hospital Course: Patient admitted to the psychiatric unit.  She was cooperative but disagreeable and argumentative.  15-minute checks in place.  Did not engage in any dangerous or aggressive behavior during her time in the hospital.  Consistently denied suicidal ideation.  Met with physician and treatment team.  She was demanding discharge yesterday but was so irritable and showed so little insight I felt that it was still appropriate to wait overnight to make sure that she had calm down.  Patient refused antidepressant medicine but did accept as needed trazodone for  sleep.  Medically her blood pressure was noted to be running low and her blood pressure medicine was held.  I am discontinuing it at discharge with the recommendation that she make sure she is eating and drinking appropriately and follow-up with her primary care doctor.  Patient is strongly encouraged to cut back or discontinue alcohol use and to reconsider seeing her doctor or a mental health provider if depressive symptoms persist.  Physical Findings: AIMS: Facial and Oral Movements Muscles of Facial Expression: None, normal Lips and Perioral Area: None, normal Jaw: None, normal Tongue: None, normal,Extremity Movements Upper (arms, wrists, hands, fingers): None, normal Lower (legs, knees, ankles, toes): None, normal, Trunk Movements Neck, shoulders, hips: None, normal, Overall Severity Severity of abnormal movements (highest score from questions above): None, normal Incapacitation due to abnormal movements: None, normal Patient's awareness of abnormal movements (rate only patient's report): No Awareness, Dental Status Current problems with teeth and/or dentures?: No Does patient usually wear dentures?: No  CIWA:  CIWA-Ar Total: 0 COWS:  COWS Total Score: 0  Musculoskeletal: Strength & Muscle Tone: within normal limits Gait & Station: normal Patient leans: N/A  Psychiatric Specialty Exam: Physical Exam  Nursing note and vitals reviewed. Constitutional: She appears well-developed and well-nourished.  HENT:  Head: Normocephalic and atraumatic.  Eyes: Pupils are equal, round, and reactive to light. Conjunctivae are normal.  Neck: Normal range of motion.  Cardiovascular: Regular rhythm and normal heart sounds.  Respiratory: Effort normal. No respiratory distress.  GI: Soft.  Musculoskeletal: Normal range of motion.  Neurological: She is alert.  Skin: Skin is warm and dry.  Psychiatric: Judgment normal. Her affect is blunt. Her speech is delayed. She is slowed. Thought content is  not paranoid. Cognition and memory are normal. She expresses no homicidal and no suicidal ideation.    Review of Systems  Constitutional: Negative.   HENT: Negative.   Eyes: Negative.   Respiratory: Negative.   Cardiovascular: Negative.   Gastrointestinal: Negative.   Musculoskeletal: Negative.   Skin: Negative.   Neurological: Negative.   Psychiatric/Behavioral: Negative for depression, hallucinations, memory loss, substance abuse and suicidal ideas. The patient is nervous/anxious and has insomnia.     Blood pressure (!) 93/59, pulse 60, temperature (!) 97.5 F (36.4 C), temperature source Oral, resp. rate 18, height _0  (1.651 m), weight 72.6 kg, SpO2 100 %.Body mass index is 26.63 kg/m.  General Appearance: Casual  Eye Contact:  Fair  Speech:  Slow  Volume:  Decreased  Mood:  Euthymic  Affect:  Constricted  Thought Process:  Coherent  Orientation:  Full (Time, Place, and Person)  Thought Content:  Logical  Suicidal Thoughts:  No  Homicidal Thoughts:  No  Memory:  Immediate;  Fair Recent;   Fair Remote;   Fair  Judgement:  Fair  Insight:  Fair  Psychomotor Activity:  Decreased  Concentration:  Concentration: Fair  Recall:  [employer] of Knowledge:  Fair  Language:  Fair  Akathisia:  No  Handed:  Right  AIMS (if indicated):     Assets:  Communication Skills Desire for Improvement Housing Physical Health Social Support  ADL's:  Intact  Cognition:  WNL  Sleep:  Number of Hours: 7.5     Have you used any form of tobacco in the last 30 days? (Cigarettes, Smokeless Tobacco, Cigars, and/or Pipes): Yes  Has this patient used any form of tobacco in the last 30 days? (Cigarettes, Smokeless Tobacco, Cigars, and/or Pipes) Yes, Yes, A prescription for an FDA-approved tobacco cessation medication was offered at discharge and the patient refused  Blood Alcohol level:  No results found for: [facility]  Metabolic Disorder Labs:  Lab Results  Component Value Date   HGBA1C 5.4  05/21/2018   MPG 108.28 05/21/2018   No results found for: PROLACTIN Lab Results  Component Value Date   CHOL 199 05/21/2018   TRIG 212 (H) 05/21/2018   HDL 84 05/21/2018   CHOLHDL 2.4 05/21/2018   VLDL 42 (H) 05/21/2018   LDLCALC 73 05/21/2018   LDLCALC 158 (H) 12/22/2012    See Psychiatric Specialty Exam and Suicide Risk Assessment completed by Attending Physician prior to discharge.  Discharge destination:  Home  Is patient on multiple antipsychotic therapies at discharge:  No   Has Patient had three or more failed trials of antipsychotic monotherapy by history:  No  Recommended Plan for Multiple Antipsychotic Therapies: NA  Discharge Instructions    Diet - low sodium heart healthy   Complete by:  As directed    Increase activity slowly   Complete by:  As directed      Allergies as of 05/22/2018   No Known Allergies     Medication List    STOP taking these medications   aspirin EC 81 MG tablet   atorvastatin 20 MG tablet Commonly known as:  LIPITOR   ergocalciferol 1.25 MG (50000 UT) capsule Commonly known as:  VITAMIN D2   hydrOXYzine 25 MG tablet Commonly known as:  ATARAX/VISTARIL   losartan-hydrochlorothiazide 100-25 MG tablet Commonly known as:  HYZAAR     TAKE these medications     Indication  cholecalciferol 25 MCG (1000 UT) tablet Commonly known as:  VITAMIN D3 Take 1 tablet (1,000 Units total) by mouth daily. Start taking on:  May 23, 2018  Indication:  Vitamin D Deficiency   clonazePAM 0.5 MG tablet Commonly known as:  KLONOPIN Take 0.5 mg by mouth 2 (two) times daily.  Indication:  Panic Disorder   cyanocobalamin 1000 MCG tablet Take 1 tablet (1,000 mcg total) by mouth daily. Start taking on:  May 23, 2018  Indication:  Inadequate Vitamin B12   traZODone 100 MG tablet Commonly known as:  DESYREL Take 1 tablet (100 mg total) by mouth at bedtime as needed for sleep.  Indication:  [place] Follow up on 05/28/2018.   Why:  Please follow up with [facility] on Friday, 05/28/18 at 2:30. Please take all hospital discharge paperwork with you to your appointment.  Thank you. Contact information: [place] [address] [phone]           Follow-up recommendations:  Activity:  Activity as tolerated Diet:  Encouraged patient to make sure she is dealing well hydrated and eating well Other:  Encourage patient to discontinue alcohol abuse and to follow-up with primary care provider and consider seeing mental health agency if depressive symptoms persist  Comments: Patient is completely denying suicidal ideation.  No longer meets commitment criteria.  Does not appear to be psychotic.  Signed: Alethia Berthold, MD 05/22/2018, 10:48 AM

## 2018-06-17 ENCOUNTER — Encounter: Payer: Self-pay | Admitting: Emergency Medicine

## 2018-06-17 ENCOUNTER — Emergency Department
Admission: EM | Admit: 2018-06-17 | Discharge: 2018-06-17 | Disposition: A | Discharge status: Home / Self Care | Payer: Medicare HMO | Attending: Emergency Medicine | Admitting: Emergency Medicine

## 2018-06-17 ENCOUNTER — Other Ambulatory Visit: Payer: Self-pay

## 2018-06-17 DIAGNOSIS — J029 Acute pharyngitis, unspecified: Secondary | ICD-10-CM | POA: Diagnosis not present

## 2018-06-17 DIAGNOSIS — I10 Essential (primary) hypertension: Secondary | ICD-10-CM | POA: Diagnosis not present

## 2018-06-17 DIAGNOSIS — F17210 Nicotine dependence, cigarettes, uncomplicated: Secondary | ICD-10-CM | POA: Diagnosis not present

## 2018-06-17 DIAGNOSIS — Z79899 Other long term (current) drug therapy: Secondary | ICD-10-CM | POA: Diagnosis not present

## 2018-06-17 DIAGNOSIS — H9209 Otalgia, unspecified ear: Secondary | ICD-10-CM | POA: Insufficient documentation

## 2018-06-17 DIAGNOSIS — R070 Pain in throat: Secondary | ICD-10-CM | POA: Diagnosis present

## 2018-06-17 MED ORDER — MENTHOL 3 MG MT LOZG
1.0000 | LOZENGE | OROMUCOSAL | Status: DC | PRN
  Administered 2018-06-17: 3 mg via ORAL
  Filled 2018-06-17: qty 9

## 2018-06-17 MED ORDER — ACETAMINOPHEN 500 MG PO TABS
1000.0000 mg | ORAL_TABLET | ORAL | Status: AC
  Administered 2018-06-17: 1000 mg via ORAL
  Filled 2018-06-17: qty 2

## 2018-06-17 NOTE — ED Provider Notes (Signed)
[facility] Emergency Department Provider Note  Erin Lewis was evaluated in Emergency Department on 06/17/2018 for the symptoms described in the history of present illness. She was evaluated in the context of the global COVID-19 pandemic, which necessitated consideration that the patient might be at risk for infection with the SARS-CoV-2 virus that causes COVID-19. Institutional protocols and algorithms that pertain to the evaluation of patients at risk for COVID-19 are in a state of rapid change based on information released by regulatory bodies including the CDC and federal and state organizations. These policies and algorithms were followed during the patient's care in the ED.  ____________________________________________   First MD Initiated Contact with Patient 06/17/18 1555     (approximate)  I have reviewed the triage vital signs and the nursing notes.  HISTORY  Chief Complaint Sore Throat and Otalgia  HPI Erin Lewis is a 63 y.o. female here for evaluation for sore throat  Patient reports for 2 days she has had sore throat.  She is able to swallow and not having trouble breathing but she reports this feels sore.  Is also noticeable the ears have been feeling stuffy she is had a slight runny nose.  No cough.  No shortness of breath.  No fever  Vomiting.  Reports a history of high blood pressure and high cholesterol.  No abdominal pain.  Reports just feels like she is stuffed up congested wants to make sure she does not have any sort of ear infection   Past Medical History:  Diagnosis Date  . Hyperlipidemia   . Hypertension     Patient Active Problem List   Diagnosis Date Noted  . Alcohol use disorder, moderate, dependence ([place]) 05/20/2018  . Recurrent major depression-severe ([place]) 05/20/2018  . Rectal bleeding 07/11/2016  . Sensory deficit, left 03/11/2016  . HTN (hypertension) 03/11/2016  . Expressive aphasia 03/11/2016  . Weakness of  left lower extremity 03/11/2016  . Major depressive disorder, recurrent, severe without psychotic features ([place]) 10/07/2014  . Anxiety reaction 10/07/2014    Past Surgical History:  Procedure Laterality Date  . [facility]  . CESAREAN SECTION     x2  . COLONOSCOPY WITH PROPOFOL N/A 07/20/2016   Procedure: COLONOSCOPY WITH PROPOFOL;  Surgeon: Robert Bellow, MD;  Location: [facility] ENDOSCOPY;  Service: Endoscopy;  Laterality: N/A;  . laproscopy     abdomen  . SHOULDER ARTHROSCOPY W/ ROTATOR CUFF REPAIR     x2  . TOTAL SHOULDER ARTHROPLASTY Right 6-7 years ago    Prior to Admission medications   Medication Sig Start Date End Date Taking? Authorizing Provider  cholecalciferol (VITAMIN D3) 25 MCG (1000 UT) tablet Take 1 tablet (1,000 Units total) by mouth daily. 05/23/18   Clapacs, Madie Reno, MD  clonazePAM (KLONOPIN) 0.5 MG tablet Take 0.5 mg by mouth 2 (two) times daily.  05/05/18   [provider]  traZODone (DESYREL) 100 MG tablet Take 1 tablet (100 mg total) by mouth at bedtime as needed for sleep. 05/22/18   Clapacs, Madie Reno, MD  vitamin B-12 1000 MCG tablet Take 1 tablet (1,000 mcg total) by mouth daily. 05/23/18   Clapacs, Madie Reno, MD    Allergies Patient has no known allergies.  Family History  Problem Relation Age of Onset  . Hypertension Other   . Diabetes Other   . Colon polyps Father 63  . Breast cancer Neg Hx     Social History Social  History   Tobacco Use  . Smoking status: Current Every Day Smoker    Packs/day: 0.50    Types: Cigarettes  . Smokeless tobacco: Never Used  Substance Use Topics  . Alcohol use: Yes    Frequency: Never    Comment: occasionally  . Drug use: No   Erin Lewis was evaluated in Emergency Department on 06/17/2018 for the symptoms described in the history of present illness. She was evaluated in the context of the global COVID-19 pandemic, which necessitated consideration that the patient might be at  risk for infection with the SARS-CoV-2 virus that causes COVID-19. Institutional protocols and algorithms that pertain to the evaluation of patients at risk for COVID-19 are in a state of rapid change based on information released by regulatory bodies including the CDC and federal and state organizations. These policies and algorithms were followed during the patient's care in the ED.   Review of Systems Constitutional: No fever/chills Eyes: No visual changes. ENT: Actually slightly sore throat.  See HPI. Cardiovascular: Denies chest pain. Respiratory: Denies shortness of breath. Gastrointestinal: No abdominal pain.   Genitourinary: Negative for dysuria. Musculoskeletal: Negative for back pain. Skin: Negative for rash. Neurological: Negative for headaches, areas of focal weakness or numbness.    ____________________________________________   PHYSICAL EXAM:  VITAL SIGNS: ED Triage Vitals [06/17/18 1538]  Enc Vitals Group     BP (!) 142/80     Pulse Rate 75     Resp 18     Temp 98.2 F (36.8 C)     Temp Source Oral     SpO2 99 %     Weight 155 lb (70.3 kg)     Height 5\' 5"  (1.651 m)     Head Circumference      Peak Flow      Pain Score 5     Pain Loc      Pain Edu?      Excl. in [place]?     Constitutional: Alert and oriented. Well appearing and in no acute distress. Eyes: Conjunctivae are normal. Head: Atraumatic. Nose: No congestion/rhinnorhea.  Tympanic membrane on the right is normal.  The left is normal in color not bulging or erythematous, but is just slightly retracted with a few bubbles noted. Mouth/Throat: Mucous membranes are moist.  Posterior oropharynx is clear widely patent just slightly erythematous.  Tonsils are normal in size without any exudates.  There is no tender or's anterior or posterior cervical adenopathy. Neck: No stridor.  She speaks in full and very clear sentences.  No trouble handling her secretions or speech. Cardiovascular: Normal rate, regular  rhythm. Grossly normal heart sounds.  Good peripheral circulation. Respiratory: Normal respiratory effort.  No retractions. Lungs CTAB. Gastrointestinal: Soft and nontender. No distention. Musculoskeletal: No lower extremity tenderness nor edema. Neurologic:  Normal speech and language. No gross focal neurologic deficits are appreciated.  Skin:  Skin is warm, dry and intact. No rash noted. Psychiatric: Mood and affect are normal. Speech and behavior are normal.  ____________________________________________   LABS (all labs ordered are listed, but only abnormal results are displayed)  Labs Reviewed - No data to display ____________________________________________  EKG   ____________________________________________  RADIOLOGY  No acute findings to indicate a need for imaging at this time ____________________________________________   PROCEDURES  Procedure(s) performed: None  Procedures  Critical Care performed: No  ____________________________________________   INITIAL IMPRESSION / ASSESSMENT AND PLAN / ED COURSE  Pertinent labs & imaging results that were available during  my care of the patient were reviewed by me and considered in my medical decision making (see chart for details).   Sore throat, some ear pain.  Ongoing for less than a week afebrile and status.  Very well-appearing with reassuring examination.  Appears likely viral pharyngitis at this time.  No oral pharyngeal edema.  No dental pain.  No facial swelling.  Very reassuring clinical examination nontoxic well-appearing with reassuring vital signs as well.  Centor criteria show very low risk for strep, antibiotics not warranted at this point  Return precautions and treatment recommendations and follow-up discussed with the patient who is agreeable with the plan.  Patient will trial over-the-counter cough drops, Tylenol.  She will follow-up closely with her primary care doctor and we are discussed return  precautions which she is agreeable with.      ____________________________________________   FINAL CLINICAL IMPRESSION(S) / ED DIAGNOSES  Final diagnoses:  Pharyngitis, unspecified etiology        Note:  This document was prepared using Dragon voice recognition software and may include unintentional dictation errors       Delman Kitten, MD 06/17/18 1617

## 2018-06-17 NOTE — ED Triage Notes (Signed)
Pt presents to ED via POV with c/o sore throat and bilateral ear pain x 2 days. Pt maintaining own secretions, currently wearing own mask from home. Pt A&O x4, NAD noted at this time.

## 2018-08-20 ENCOUNTER — Encounter: Payer: Self-pay | Admitting: *Deleted

## 2018-08-20 ENCOUNTER — Emergency Department
Admission: EM | Admit: 2018-08-20 | Discharge: 2018-08-20 | Discharge status: Against Medical Advice | Payer: Medicare HMO | Attending: Emergency Medicine | Admitting: Emergency Medicine

## 2018-08-20 ENCOUNTER — Emergency Department: Payer: Medicare HMO

## 2018-08-20 ENCOUNTER — Other Ambulatory Visit: Payer: Self-pay

## 2018-08-20 DIAGNOSIS — R51 Headache: Secondary | ICD-10-CM | POA: Diagnosis not present

## 2018-08-20 DIAGNOSIS — I10 Essential (primary) hypertension: Secondary | ICD-10-CM | POA: Diagnosis not present

## 2018-08-20 DIAGNOSIS — Z20828 Contact with and (suspected) exposure to other viral communicable diseases: Secondary | ICD-10-CM | POA: Diagnosis not present

## 2018-08-20 DIAGNOSIS — R05 Cough: Secondary | ICD-10-CM | POA: Diagnosis present

## 2018-08-20 DIAGNOSIS — Z5329 Procedure and treatment not carried out because of patient's decision for other reasons: Secondary | ICD-10-CM | POA: Insufficient documentation

## 2018-08-20 DIAGNOSIS — M791 Myalgia, unspecified site: Secondary | ICD-10-CM | POA: Insufficient documentation

## 2018-08-20 DIAGNOSIS — R0602 Shortness of breath: Secondary | ICD-10-CM | POA: Insufficient documentation

## 2018-08-20 DIAGNOSIS — F17210 Nicotine dependence, cigarettes, uncomplicated: Secondary | ICD-10-CM | POA: Insufficient documentation

## 2018-08-20 DIAGNOSIS — Z79899 Other long term (current) drug therapy: Secondary | ICD-10-CM | POA: Insufficient documentation

## 2018-08-20 DIAGNOSIS — R059 Cough, unspecified: Secondary | ICD-10-CM

## 2018-08-20 DIAGNOSIS — Z96611 Presence of right artificial shoulder joint: Secondary | ICD-10-CM | POA: Insufficient documentation

## 2018-08-20 LAB — BASIC METABOLIC PANEL
Anion gap: 9 (ref 5–15)
BUN: 27 mg/dL — ABNORMAL HIGH (ref 8–23)
CO2: 24 mmol/L (ref 22–32)
Calcium: 9.3 mg/dL (ref 8.9–10.3)
Chloride: 107 mmol/L (ref 98–111)
Creatinine, Ser: 1.17 mg/dL — ABNORMAL HIGH (ref 0.44–1.00)
GFR calc Af Amer: 58 mL/min — ABNORMAL LOW (ref >60)
GFR calc non Af Amer: 50 mL/min — ABNORMAL LOW (ref >60)
Glucose, Bld: 74 mg/dL (ref 70–99)
Potassium: 5 mmol/L (ref 3.5–5.1)
Sodium: 140 mmol/L (ref 135–145)

## 2018-08-20 LAB — CBC
HCT: 40.8 % (ref 36.0–46.0)
Hemoglobin: 13.6 g/dL (ref 12.0–15.0)
MCH: 31.3 pg (ref 26.0–34.0)
MCHC: 33.3 g/dL (ref 30.0–36.0)
MCV: 93.8 fL (ref 80.0–100.0)
Platelets: 281 10*3/uL (ref 150–400)
RBC: 4.35 MIL/uL (ref 3.87–5.11)
RDW: 15.3 % (ref 11.5–15.5)
WBC: 6.5 10*3/uL (ref 4.0–10.5)
nRBC: 0 % (ref 0.0–0.2)

## 2018-08-20 LAB — TROPONIN I: Troponin I: <0.03 ng/mL (ref <0.03)

## 2018-08-20 MED ORDER — IBUPROFEN 400 MG PO TABS
600.0000 mg | ORAL_TABLET | ORAL | Status: AC
  Administered 2018-08-20: 600 mg via ORAL
  Filled 2018-08-20: qty 2

## 2018-08-20 MED ORDER — BENZONATATE 100 MG PO CAPS
100.0000 mg | ORAL_CAPSULE | ORAL | Status: AC
  Administered 2018-08-20: 100 mg via ORAL
  Filled 2018-08-20: qty 1

## 2018-08-20 NOTE — ED Triage Notes (Signed)
Pt c/o shortness of breath starting this morning. Pt is exhibiting upper airway constriction that is intermittent and appears voluntary related association with a weak cough immediately afterward. Pt also exhibited the "wheezing" in her throat while EDP was auscultating her lungs. Pt does not exhibit any respiratory distress beyond this. Pt c/o headache. Pt states she took tylenol x 2 hrs ago. Pt was at a family function this weekend but denies exposure to known covid positive people while there. Pt stated her supervisor was tested and positive for covid x 3 days ago, but pt states she has had no contact with him for 3 weeks, although her sister has had contact with the supervisor more recently.

## 2018-08-20 NOTE — ED Notes (Signed)
This RN went to bedside to check on patient, pt not found in room. Per [employer], RN note patient IV had been removed and patient was removed from monitoring devices.

## 2018-08-20 NOTE — ED Notes (Addendum)
Patient hit call bell. When this RN entered room, patient is crying hysterically asking "why am I still here? Why has no one helped me. What did the corona swab say. Take everythign off of me. I want my paper work and to leave" This RN relayed to patient the covid results would not be back for 2-3 days and they would call her with results. PAtient made this RN take out IV and unhook all vitals cords. MD made aware

## 2018-08-20 NOTE — ED Provider Notes (Signed)
[facility] Emergency Department Provider Note  ____________________________________________   First MD Initiated Contact with Patient 08/20/18 1601     (approximate)  I have reviewed the triage vital signs and the nursing notes.   HISTORY  Chief Complaint Shortness of Breath    HPI Erin Lewis is a 63 y.o. female   history of hypertension hyperlipidemia alcohol dependence  Patient presents today reports she is had a slight cough and feeling of slight shortness of breath over the last day.  She reports that she is also had a very light headache and she took Tylenol for this.  She does not have any known exposure to them with coronavirus.  She does report she is had some slight muscle aches.  No nausea vomiting.  She denies fevers but reports some chills.  No chest pain.  Slight shortness of breath.  Nothing seems to make it better or worse.  Denies any history of blood clots.  Does not take any blood thinners.  No history of recent long trips, travel, immobilization, no estrogen use, no surgeries.  Past Medical History:  Diagnosis Date  . Hyperlipidemia   . Hypertension     Patient Active Problem List   Diagnosis Date Noted  . Alcohol use disorder, moderate, dependence ([place]) 05/20/2018  . Recurrent major depression-severe ([place]) 05/20/2018  . Rectal bleeding 07/11/2016  . Sensory deficit, left 03/11/2016  . HTN (hypertension) 03/11/2016  . Expressive aphasia 03/11/2016  . Weakness of left lower extremity 03/11/2016  . Major depressive disorder, recurrent, severe without psychotic features ([employer]) 10/07/2014  . Anxiety reaction 10/07/2014    Past Surgical History:  Procedure Laterality Date  . [facility]BD[facility]  . CESAREAN SECTION     x2  . COLONOSCOPY WITH PROPOFOL N/A 07/20/2016   Procedure: COLONOSCOPY WITH PROPOFOL;  Surgeon: Robert Bellow, MD;  Location: [facility] ENDOSCOPY;  Service: Endoscopy;  Laterality: N/A;   . laproscopy     abdomen  . SHOULDER ARTHROSCOPY W/ ROTATOR CUFF REPAIR     x2  . TOTAL SHOULDER ARTHROPLASTY Right 6-7 years ago    Prior to Admission medications   Medication Sig Start Date End Date Taking? Authorizing Provider  cholecalciferol (VITAMIN D3) 25 MCG (1000 UT) tablet Take 1 tablet (1,000 Units total) by mouth daily. 05/23/18   Clapacs, Madie Reno, MD  clonazePAM (KLONOPIN) 0.5 MG tablet Take 0.5 mg by mouth 2 (two) times daily.  05/05/18   [provider]  traZODone (DESYREL) 100 MG tablet Take 1 tablet (100 mg total) by mouth at bedtime as needed for sleep. 05/22/18   Clapacs, Madie Reno, MD  vitamin B-12 1000 MCG tablet Take 1 tablet (1,000 mcg total) by mouth daily. 05/23/18   Clapacs, Madie Reno, MD    Allergies Patient has no known allergies.  Family History  Problem Relation Age of Onset  . Hypertension Other   . Diabetes Other   . Colon polyps Father 97  . Breast cancer Neg Hx     Social History Social History   Tobacco Use  . Smoking status: Current Every Day Smoker    Packs/day: 0.50    Types: Cigarettes  . Smokeless tobacco: Never Used  Substance Use Topics  . Alcohol use: Yes    Frequency: Never    Comment: occasionally  . Drug use: No    Review of Systems Constitutional: No fever some chills slight fatigue Eyes: No visual changes. ENT: No sore  throat. Cardiovascular: Denies chest pain. Respiratory: Occasional dry cough over the last day with slight feeling of shortness of breath.  Occasionally feels like she has a little bit of wheezing feeling Gastrointestinal: No abdominal pain.   Genitourinary: Negative for dysuria. Musculoskeletal: Negative for back pain.  Some occasional muscle aches. Skin: Negative for rash. Neurological: Negative for areas of focal weakness or numbness.  She reports a light headache that got better with Tylenol earlier.    ____________________________________________   PHYSICAL EXAM:  VITAL SIGNS: ED Triage  Vitals [08/20/18 1607]  Enc Vitals Group     BP 135/70     Pulse Rate 66     Resp 13     Temp 98.6 F (37 C)     Temp Source Oral     SpO2 100 %     Weight      Height      Head Circumference      Peak Flow      Pain Score      Pain Loc      Pain Edu?      Excl. in [place]?     Constitutional: Alert and oriented. Well appearing and in no acute distress.  She appears in no distress. Eyes: Conjunctivae are normal. Head: Atraumatic. Nose: No congestion/rhinnorhea. Mouth/Throat: Mucous membranes are moist. Neck: No stridor.  Cardiovascular: Normal rate, regular rhythm. Grossly normal heart sounds.  Good peripheral circulation. Respiratory: Normal respiratory effort.  No retractions. Lungs CTAB.  Speaks in full and clear sentences with normal respirations and very clear lungs without wheezing. Gastrointestinal: Soft and nontender. No distention. Musculoskeletal: No lower extremity tenderness nor edema. Neurologic:  Normal speech and language. No gross focal neurologic deficits are appreciated.  Skin:  Skin is warm, dry and intact. No rash noted. Psychiatric: Mood and affect are normal. Speech and behavior are normal.  ____________________________________________   LABS (all labs ordered are listed, but only abnormal results are displayed)  Labs Reviewed  BASIC METABOLIC PANEL - Abnormal; Notable for the following components:      Result Value   BUN 27 (*)    Creatinine, Ser 1.17 (*)    GFR calc non Af Amer 50 (*)    GFR calc Af Amer 58 (*)    All other components within normal limits  NOVEL CORONAVIRUS, NAA (HOSPITAL ORDER, SEND-OUT TO REF LAB)  CBC  TROPONIN I   ____________________________________________  EKG   ____________________________________________  RADIOLOGY  Dg Chest Port 1 View  Result Date: 08/20/2018 CLINICAL DATA:  Shortness of breath. EXAM: PORTABLE CHEST 1 VIEW COMPARISON:  12/21/2012 FINDINGS: Cardiomediastinal silhouette is normal. Mediastinal  contours appear intact. There is no evidence of focal airspace consolidation, pleural effusion or pneumothorax. Osseous structures are without acute abnormality. Soft tissues are grossly normal. IMPRESSION: No active disease. Electronically Signed   By: Fidela Salisbury M.D.   On: 08/20/2018 16:55    CT negative for acute ____________________________________________   PROCEDURES  Procedure(s) performed: None  Procedures  Critical Care performed: No  ____________________________________________   INITIAL IMPRESSION / ASSESSMENT AND PLAN / ED COURSE  Pertinent labs & imaging results that were available during my care of the patient were reviewed by me and considered in my medical decision making (see chart for details).   Patient presents for evaluation of some myalgias, mild headache associated with a dry cough and slight dyspnea.  Very reassuring clinical examination appears quite nontoxic.  Afebrile with normal vital signs.  Lungs clear to auscultation  with no noted elevated work of breathing or hypoxia.  Denies chest pain.  No notable risk factors for pulmonary embolism, no chest pain no symptoms suggest ACS.  Symptoms and evaluation seem most consistent with likely viral upper respiratory infection, consider possible COVID though I suspect unlikely without the fever.  Will send outpatient COVID test  Discussed with the patient and plan of care, we will send a coronavirus test.  ----------------------------------------- 8:29 PM on 08/20/2018 -----------------------------------------  The patient eloped sometime ago.  I was not notified of the patient's elopement immediately, however I was also tending to the care of a critically ill patient.    Cheralyn Oliver was evaluated in Emergency Department on 08/20/2018 for the symptoms described in the history of present illness. She was evaluated in the context of the global COVID-19 pandemic, which necessitated consideration that the  patient might be at risk for infection with the SARS-CoV-2 virus that causes COVID-19. Institutional protocols and algorithms that pertain to the evaluation of patients at risk for COVID-19 are in a state of rapid change based on information released by regulatory bodies including the CDC and federal and state organizations. These policies and algorithms were followed during the patient's care in the ED.   ____________________________________________   FINAL CLINICAL IMPRESSION(S) / ED DIAGNOSES  Final diagnoses:  Cough        Note:  This document was prepared using Dragon voice recognition software and may include unintentional dictation errors       Delman Kitten, MD 08/20/18 2124

## 2018-08-21 LAB — NOVEL CORONAVIRUS, NAA (HOSP ORDER, SEND-OUT TO REF LAB; TAT 18-24 HRS): SARS-CoV-2, NAA: NOT DETECTED

## 2018-08-22 ENCOUNTER — Telehealth: Payer: Self-pay | Admitting: Emergency Medicine

## 2018-08-22 NOTE — Telephone Encounter (Signed)
Called patient and informed of negative covid 19 test.

## 2018-10-18 ENCOUNTER — Other Ambulatory Visit: Payer: Self-pay

## 2018-10-18 ENCOUNTER — Ambulatory Visit: Payer: Medicare HMO | Admitting: Licensed Clinical Social Worker

## 2018-10-18 ENCOUNTER — Ambulatory Visit
Admission: RE | Admit: 2018-10-18 | Discharge: 2018-10-18 | Disposition: A | Discharge status: Home / Self Care | Payer: Medicare HMO | Source: Ambulatory Visit | Attending: Internal Medicine | Admitting: Internal Medicine

## 2018-10-18 DIAGNOSIS — Z1231 Encounter for screening mammogram for malignant neoplasm of breast: Secondary | ICD-10-CM | POA: Insufficient documentation

## 2018-10-24 ENCOUNTER — Other Ambulatory Visit: Payer: Self-pay

## 2018-10-24 ENCOUNTER — Ambulatory Visit (INDEPENDENT_AMBULATORY_CARE_PROVIDER_SITE_OTHER): Payer: Medicare HMO | Admitting: Licensed Clinical Social Worker

## 2018-10-24 DIAGNOSIS — F332 Major depressive disorder, recurrent severe without psychotic features: Secondary | ICD-10-CM | POA: Diagnosis not present

## 2018-10-25 ENCOUNTER — Other Ambulatory Visit: Payer: Self-pay

## 2018-10-25 ENCOUNTER — Encounter: Payer: Self-pay | Admitting: Emergency Medicine

## 2018-10-25 ENCOUNTER — Emergency Department
Admission: EM | Admit: 2018-10-25 | Discharge: 2018-10-25 | Disposition: A | Discharge status: Home / Self Care | Payer: Medicare HMO | Attending: Emergency Medicine | Admitting: Emergency Medicine

## 2018-10-25 ENCOUNTER — Emergency Department: Payer: Medicare HMO

## 2018-10-25 DIAGNOSIS — I10 Essential (primary) hypertension: Secondary | ICD-10-CM | POA: Insufficient documentation

## 2018-10-25 DIAGNOSIS — Z79899 Other long term (current) drug therapy: Secondary | ICD-10-CM | POA: Insufficient documentation

## 2018-10-25 DIAGNOSIS — S7001XA Contusion of right hip, initial encounter: Secondary | ICD-10-CM | POA: Diagnosis not present

## 2018-10-25 DIAGNOSIS — Y929 Unspecified place or not applicable: Secondary | ICD-10-CM | POA: Insufficient documentation

## 2018-10-25 DIAGNOSIS — F17210 Nicotine dependence, cigarettes, uncomplicated: Secondary | ICD-10-CM | POA: Insufficient documentation

## 2018-10-25 DIAGNOSIS — W010XXA Fall on same level from slipping, tripping and stumbling without subsequent striking against object, initial encounter: Secondary | ICD-10-CM | POA: Insufficient documentation

## 2018-10-25 DIAGNOSIS — Y9301 Activity, walking, marching and hiking: Secondary | ICD-10-CM | POA: Diagnosis not present

## 2018-10-25 DIAGNOSIS — S79911A Unspecified injury of right hip, initial encounter: Secondary | ICD-10-CM | POA: Diagnosis present

## 2018-10-25 DIAGNOSIS — Y990 Civilian activity done for income or pay: Secondary | ICD-10-CM | POA: Insufficient documentation

## 2018-10-25 LAB — CBC WITH DIFFERENTIAL/PLATELET
Abs Immature Granulocytes: 0.03 10*3/uL (ref 0.00–0.07)
Basophils Absolute: 0 10*3/uL (ref 0.0–0.1)
Basophils Relative: 0 %
Eosinophils Absolute: 0.2 10*3/uL (ref 0.0–0.5)
Eosinophils Relative: 3 %
HCT: 35.3 % — ABNORMAL LOW (ref 36.0–46.0)
Hemoglobin: 11.8 g/dL — ABNORMAL LOW (ref 12.0–15.0)
Immature Granulocytes: 0 %
Lymphocytes Relative: 40 %
Lymphs Abs: 3.3 10*3/uL (ref 0.7–4.0)
MCH: 31.4 pg (ref 26.0–34.0)
MCHC: 33.4 g/dL (ref 30.0–36.0)
MCV: 93.9 fL (ref 80.0–100.0)
Monocytes Absolute: 0.4 10*3/uL (ref 0.1–1.0)
Monocytes Relative: 5 %
Neutro Abs: 4.3 10*3/uL (ref 1.7–7.7)
Neutrophils Relative %: 52 %
Platelets: 276 10*3/uL (ref 150–400)
RBC: 3.76 MIL/uL — ABNORMAL LOW (ref 3.87–5.11)
RDW: 14 % (ref 11.5–15.5)
WBC: 8.3 10*3/uL (ref 4.0–10.5)
nRBC: 0 % (ref 0.0–0.2)

## 2018-10-25 LAB — TYPE AND SCREEN
ABO/RH(D): A POS
Antibody Screen: NEGATIVE

## 2018-10-25 LAB — COMPREHENSIVE METABOLIC PANEL
ALT: 16 U/L (ref 0–44)
AST: 26 U/L (ref 15–41)
Albumin: 4.2 g/dL (ref 3.5–5.0)
Alkaline Phosphatase: 51 U/L (ref 38–126)
Anion gap: 10 (ref 5–15)
BUN: 36 mg/dL — ABNORMAL HIGH (ref 8–23)
CO2: 21 mmol/L — ABNORMAL LOW (ref 22–32)
Calcium: 9.1 mg/dL (ref 8.9–10.3)
Chloride: 102 mmol/L (ref 98–111)
Creatinine, Ser: 1.33 mg/dL — ABNORMAL HIGH (ref 0.44–1.00)
GFR calc Af Amer: 50 mL/min — ABNORMAL LOW (ref >60)
GFR calc non Af Amer: 43 mL/min — ABNORMAL LOW (ref >60)
Glucose, Bld: 85 mg/dL (ref 70–99)
Potassium: 4.1 mmol/L (ref 3.5–5.1)
Sodium: 133 mmol/L — ABNORMAL LOW (ref 135–145)
Total Bilirubin: 0.6 mg/dL (ref 0.3–1.2)
Total Protein: 7.3 g/dL (ref 6.5–8.1)

## 2018-10-25 MED ORDER — ONDANSETRON HCL 4 MG/2ML IJ SOLN
4.0000 mg | Freq: Once | INTRAMUSCULAR | Status: AC
  Administered 2018-10-25: 4 mg via INTRAVENOUS
  Filled 2018-10-25: qty 2

## 2018-10-25 MED ORDER — MORPHINE SULFATE (PF) 4 MG/ML IV SOLN
4.0000 mg | Freq: Once | INTRAVENOUS | Status: AC
  Administered 2018-10-25: 4 mg via INTRAVENOUS
  Filled 2018-10-25: qty 1

## 2018-10-25 MED ORDER — MORPHINE SULFATE (PF) 4 MG/ML IV SOLN
4.0000 mg | Freq: Once | INTRAVENOUS | Status: AC
  Administered 2018-10-25: 17:00:00 4 mg via INTRAVENOUS
  Filled 2018-10-25: qty 1

## 2018-10-25 MED ORDER — LORAZEPAM 2 MG/ML IJ SOLN
1.0000 mg | Freq: Once | INTRAMUSCULAR | Status: AC
  Administered 2018-10-25: 1 mg via INTRAVENOUS
  Filled 2018-10-25: qty 1

## 2018-10-25 NOTE — ED Provider Notes (Signed)
[facility] Emergency Department Provider Note   ____________________________________________   First MD Initiated Contact with Patient 10/25/18 1624     (approximate)  I have reviewed the triage vital signs and the nursing notes.   HISTORY  Chief Complaint Leg Injury and Fall    HPI Erin Lewis is a 62 y.o. female who reports she was walking at work and her legs went out from under her and she landed on her right hip.  She complains of pain in the right hip and in the low back.  There is some shortening of the right hip.  No other injuries.  Pain is moderately severe.  Worse with movement and achy.         Past Medical History:  Diagnosis Date  . Hyperlipidemia   . Hypertension     Patient Active Problem List   Diagnosis Date Noted  . Alcohol use disorder, moderate, dependence ([place]) 05/20/2018  . Recurrent major depression-severe ([place]) 05/20/2018  . Rectal bleeding 07/11/2016  . Sensory deficit, left 03/11/2016  . HTN (hypertension) 03/11/2016  . Expressive aphasia 03/11/2016  . Weakness of left lower extremity 03/11/2016  . Major depressive disorder, recurrent, severe without psychotic features ([place]) 10/07/2014  . Anxiety reaction 10/07/2014    Past Surgical History:  Procedure Laterality Date  . [facility]  . CESAREAN SECTION     x2  . COLONOSCOPY WITH PROPOFOL N/A 07/20/2016   Procedure: COLONOSCOPY WITH PROPOFOL;  Surgeon: Robert Bellow, MD;  Location: [facility] ENDOSCOPY;  Service: Endoscopy;  Laterality: N/A;  . laproscopy     abdomen  . SHOULDER ARTHROSCOPY W/ ROTATOR CUFF REPAIR     x2  . TOTAL SHOULDER ARTHROPLASTY Right 6-7 years ago    Prior to Admission medications   Medication Sig Start Date End Date Taking? Authorizing Provider  cholecalciferol (VITAMIN D3) 25 MCG (1000 UT) tablet Take 1 tablet (1,000 Units total) by mouth daily. 05/23/18   Clapacs, Madie Reno, MD  clonazePAM (KLONOPIN)  0.5 MG tablet Take 0.5 mg by mouth 2 (two) times daily.  05/05/18   [provider]  traZODone (DESYREL) 100 MG tablet Take 1 tablet (100 mg total) by mouth at bedtime as needed for sleep. 05/22/18   Clapacs, Madie Reno, MD  vitamin B-12 1000 MCG tablet Take 1 tablet (1,000 mcg total) by mouth daily. 05/23/18   Clapacs, Madie Reno, MD    Allergies Patient has no known allergies.  Family History  Problem Relation Age of Onset  . Hypertension Other   . Diabetes Other   . Colon polyps Father 62  . Breast cancer Neg Hx     Social History Social History   Tobacco Use  . Smoking status: Current Every Day Smoker    Packs/day: 0.50    Types: Cigarettes  . Smokeless tobacco: Never Used  Substance Use Topics  . Alcohol use: Yes    Frequency: Never    Comment: occasionally  . Drug use: No    Review of Systems  Constitutional: No fever/chills Eyes: No visual changes. ENT: No sore throat. Cardiovascular: Denies chest pain. Respiratory: Denies shortness of breath. Gastrointestinal: No abdominal pain.  No nausea, no vomiting.  No diarrhea.  No constipation. Genitourinary: Negative for dysuria. Musculoskeletal: Negative for back pain. Skin: Negative for rash. Neurological: Negative for headaches, focal weakness   ____________________________________________   PHYSICAL EXAM:  VITAL SIGNS: ED Triage Vitals  Enc Vitals Group  BP --      Pulse --      Resp --      Temp --      Temp src --      SpO2 --      Weight 10/25/18 1609 154 lb (69.9 kg)     Height 10/25/18 1609 5\' 5"  (1.651 m)     Head Circumference --      Peak Flow --      Pain Score 10/25/18 1608 5     Pain Loc --      Pain Edu? --      Excl. in [place]? --     Constitutional: Alert and oriented. Well appearing and in mild acute distress. Eyes: Conjunctivae are normal.  Head: Atraumatic. Nose: No congestion/rhinnorhea. Mouth/Throat: Mucous membranes are moist.  Oropharynx non-erythematous. Neck: No stridor.     Cardiovascular: Normal rate, regular rhythm. Grossly normal heart sounds.  Good peripheral circulation. Respiratory: Normal respiratory effort.  No retractions. Lungs CTAB. Gastrointestinal: Soft and nontender. No distention. No abdominal bruits. No CVA tenderness. Musculoskeletal: Left hip is tender additionally the patient is tender in the low back and right paraspinous muscles. Neurologic:  Normal speech and language. No gross focal neurologic deficits are appreciated.  Skin:  Skin is warm, dry and intact. No rash noted.   ____________________________________________   LABS (all labs ordered are listed, but only abnormal results are displayed)  Labs Reviewed  CBC WITH DIFFERENTIAL/PLATELET  COMPREHENSIVE METABOLIC PANEL  TYPE AND SCREEN   ____________________________________________  EKG   ____________________________________________  RADIOLOGY  ED MD interpretation:    Official radiology report(s): No results found.  ____________________________________________   PROCEDURES  Procedure(s) performed (including Critical Care):  Procedures   ____________________________________________   INITIAL IMPRESSION / ASSESSMENT AND PLAN / ED COURSE  Patient is barely able to walk with 2 people holding her up.  Additionally she swings her right leg got very wide and around which she is new she says since the fall.  With the back pain and hip pain and her difficulty bearing weight I will get an MRI of her L spine and hip.             ____________________________________________   FINAL CLINICAL IMPRESSION(S) / ED DIAGNOSES  Final diagnoses:  None     ED Discharge Orders    None       Note:  This document was prepared using Dragon voice recognition software and may include unintentional dictation errors.    Nena Polio, MD 10/25/18 [id number]

## 2018-10-25 NOTE — ED Triage Notes (Signed)
Pt arrived via [facility] from work for fall complaining of rt hip pain. EMS noted shortening and rotation of rt leg on arrival. Pt 2/10 pain after 50mg  fentanyl given by EMS.

## 2018-10-25 NOTE — Discharge Instructions (Addendum)
The x-ray and MRI were negative.  You do have some bruising in that area.  Please use Tylenol for the pain.  You can use ice for 20 minutes every hour as well.  Do not fall asleep on the ice as you can get burns and frostbite.  After 24 to 48 hours you can try heat to the area.  Same with the heat, do not fall asleep on it as you can get burns.  Please return for increasing pain swelling or any other problem.

## 2018-10-25 NOTE — ED Notes (Signed)
Pt ambulated with little assistance to bathroom

## 2018-10-25 NOTE — ED Notes (Signed)
Patient transported to MRI 

## 2018-10-25 NOTE — ED Notes (Signed)
Per Dr Cinda Quest pt to be ambulated - with the assistance of Vicente Males RN pt was stood from a sitting position - she was able to bear weight on both legs however she has an overly exaggerated rotation of right hip out with every step and rotation in of right foot when stepping down - this presentation was described to Dr Cinda Quest and MRI orders to be placed per provider - pt has facial grimacing and moaning with each step

## 2018-10-25 NOTE — ED Notes (Signed)
MRI called and stated to medicate pt and they would be to get her in 5 minutes

## 2018-11-04 NOTE — Progress Notes (Signed)
Virtual Visit via Telephone Note  I connected with Erin Lewis on 10/24/18 at  3:00 PM EDT by telephone and verified that I am speaking with the correct person using two identifiers.  Location: Patient: work Provider: office   I discussed the limitations, risks, security and privacy concerns of performing an evaluation and management service by telephone and the availability of in person appointments. I also discussed with the patient that there may be a patient responsible charge related to this service. The patient expressed understanding and agreed to proceed.      I discussed the assessment and treatment plan with the patient. The patient was provided an opportunity to ask questions and all were answered. The patient agreed with the plan and demonstrated an understanding of the instructions.   The patient was advised to call back or seek an in-person evaluation if the symptoms worsen or if the condition fails to improve as anticipated.  I provided 30 minutes of non-face-to-face time during this encounter.   Lubertha South, LCSW   THERAPIST PROGRESS NOTE  Session Time: 30  Participation Level: Active  Type of Therapy: Individual Therapy  Treatment Goals addressed: Coping  Interventions: CBT and Motivational Interviewing  Summary: Erin Lewis is a 63 y.o. female who presents with continued symptoms of diagnosis.  Therapist met with Patient in an initial therapy session to assess current mood and to build rapport. Therapist engaged Patient in discussion about her life and what is going well for her. Therapist provided support for Patient as she shared details about her life, her current stressors, mood, coping skills, her past, and her children. Therapist prompted Patient to discuss her support system and ways that she manages her daily stress, anger, and frustrations.   LCSW discussed what psychotherapy is and is not and the importance of the therapeutic  relationship to include open and honest communication between client and therapist and building trust.  Reviewed advantages and disadvantages of the therapeutic process and limitations to the therapeutic relationship including LCSW's role in maintaining the safety of the client, others and those in client's care.    Suicidal/Homicidal: No  Plan: Return again in 1 weeks.  Diagnosis: Axis I: Depressive Disorder NOS    Axis II: No diagnosis    Lubertha South, LCSW 10/24/2018

## 2018-11-19 ENCOUNTER — Ambulatory Visit (INDEPENDENT_AMBULATORY_CARE_PROVIDER_SITE_OTHER): Payer: Medicare HMO | Admitting: Psychiatry

## 2018-11-19 ENCOUNTER — Other Ambulatory Visit: Payer: Self-pay

## 2018-11-19 DIAGNOSIS — Z5329 Procedure and treatment not carried out because of patient's decision for other reasons: Secondary | ICD-10-CM

## 2018-11-19 NOTE — Progress Notes (Signed)
No response to calls 

## 2019-01-03 ENCOUNTER — Other Ambulatory Visit: Payer: Self-pay

## 2019-01-03 DIAGNOSIS — Z20822 Contact with and (suspected) exposure to covid-19: Secondary | ICD-10-CM

## 2019-01-04 LAB — NOVEL CORONAVIRUS, NAA: SARS-CoV-2, NAA: NOT DETECTED

## 2019-01-07 ENCOUNTER — Telehealth: Payer: Self-pay | Admitting: Internal Medicine

## 2019-01-07 NOTE — Telephone Encounter (Signed)
Patient call for Covid test results.  She was told that Covid was Not Detected.

## 2019-04-29 ENCOUNTER — Ambulatory Visit: Payer: Self-pay

## 2019-05-29 ENCOUNTER — Other Ambulatory Visit: Payer: Self-pay

## 2019-05-29 ENCOUNTER — Encounter: Payer: Self-pay | Admitting: Emergency Medicine

## 2019-05-29 ENCOUNTER — Emergency Department: Payer: Medicare HMO

## 2019-05-29 ENCOUNTER — Inpatient Hospital Stay
Admission: EM | Admit: 2019-05-29 | Discharge: 2019-05-31 | DRG: 641 | Disposition: A | Discharge status: Home / Self Care | Payer: Medicare HMO | Attending: Internal Medicine | Admitting: Internal Medicine

## 2019-05-29 DIAGNOSIS — F419 Anxiety disorder, unspecified: Secondary | ICD-10-CM | POA: Diagnosis present

## 2019-05-29 DIAGNOSIS — F329 Major depressive disorder, single episode, unspecified: Secondary | ICD-10-CM | POA: Diagnosis present

## 2019-05-29 DIAGNOSIS — Z20822 Contact with and (suspected) exposure to covid-19: Secondary | ICD-10-CM | POA: Diagnosis present

## 2019-05-29 DIAGNOSIS — I10 Essential (primary) hypertension: Secondary | ICD-10-CM | POA: Diagnosis present

## 2019-05-29 DIAGNOSIS — R42 Dizziness and giddiness: Secondary | ICD-10-CM | POA: Diagnosis not present

## 2019-05-29 DIAGNOSIS — N390 Urinary tract infection, site not specified: Secondary | ICD-10-CM | POA: Diagnosis present

## 2019-05-29 DIAGNOSIS — F1010 Alcohol abuse, uncomplicated: Secondary | ICD-10-CM | POA: Diagnosis present

## 2019-05-29 DIAGNOSIS — Z8249 Family history of ischemic heart disease and other diseases of the circulatory system: Secondary | ICD-10-CM

## 2019-05-29 DIAGNOSIS — R739 Hyperglycemia, unspecified: Secondary | ICD-10-CM | POA: Diagnosis present

## 2019-05-29 DIAGNOSIS — Z96611 Presence of right artificial shoulder joint: Secondary | ICD-10-CM | POA: Diagnosis present

## 2019-05-29 DIAGNOSIS — E785 Hyperlipidemia, unspecified: Secondary | ICD-10-CM | POA: Diagnosis present

## 2019-05-29 DIAGNOSIS — Z8371 Family history of colonic polyps: Secondary | ICD-10-CM

## 2019-05-29 DIAGNOSIS — F17210 Nicotine dependence, cigarettes, uncomplicated: Secondary | ICD-10-CM | POA: Diagnosis present

## 2019-05-29 DIAGNOSIS — E871 Hypo-osmolality and hyponatremia: Secondary | ICD-10-CM | POA: Diagnosis not present

## 2019-05-29 DIAGNOSIS — E876 Hypokalemia: Secondary | ICD-10-CM | POA: Diagnosis present

## 2019-05-29 LAB — DIFFERENTIAL
Abs Immature Granulocytes: 0 10*3/uL (ref 0.00–0.07)
Basophils Absolute: 0 10*3/uL (ref 0.0–0.1)
Basophils Relative: 0 %
Eosinophils Absolute: 0 10*3/uL (ref 0.0–0.5)
Eosinophils Relative: 0 %
Immature Granulocytes: 0 %
Lymphocytes Relative: 16 %
Lymphs Abs: 1.4 10*3/uL (ref 0.7–4.0)
Monocytes Absolute: 0.7 10*3/uL (ref 0.1–1.0)
Monocytes Relative: 10 %
Neutro Abs: 5.6 10*3/uL (ref 1.7–7.7)
Neutrophils Relative %: 74 %

## 2019-05-29 LAB — NA AND K (SODIUM & POTASSIUM), RAND UR
Potassium Urine: 21 mmol/L
Sodium, Ur: 35 mmol/L

## 2019-05-29 LAB — BASIC METABOLIC PANEL
Anion gap: 5 (ref 5–15)
Anion gap: 11 (ref 5–15)
BUN: 7 mg/dL — ABNORMAL LOW (ref 8–23)
BUN: 8 mg/dL (ref 8–23)
CO2: 21 mmol/L — ABNORMAL LOW (ref 22–32)
CO2: 25 mmol/L (ref 22–32)
Calcium: 5.9 mg/dL — CL (ref 8.9–10.3)
Calcium: 10.4 mg/dL — ABNORMAL HIGH (ref 8.9–10.3)
Chloride: 96 mmol/L — ABNORMAL LOW (ref 98–111)
Chloride: 75 mmol/L — ABNORMAL LOW (ref 98–111)
Creatinine, Ser: 0.54 mg/dL (ref 0.44–1.00)
Creatinine, Ser: 0.83 mg/dL (ref 0.44–1.00)
GFR calc Af Amer: >60 mL/min (ref >60)
GFR calc Af Amer: >60 mL/min (ref >60)
GFR calc non Af Amer: >60 mL/min (ref >60)
GFR calc non Af Amer: >60 mL/min (ref >60)
Glucose, Bld: 66 mg/dL — ABNORMAL LOW (ref 70–99)
Glucose, Bld: 91 mg/dL (ref 70–99)
Potassium: 2.2 mmol/L — CL (ref 3.5–5.1)
Potassium: 3.6 mmol/L (ref 3.5–5.1)
Sodium: 122 mmol/L — ABNORMAL LOW (ref 135–145)
Sodium: 111 mmol/L — CL (ref 135–145)

## 2019-05-29 LAB — COMPREHENSIVE METABOLIC PANEL
ALT: 22 U/L (ref 0–44)
AST: 36 U/L (ref 15–41)
Albumin: 4.9 g/dL (ref 3.5–5.0)
Alkaline Phosphatase: 69 U/L (ref 38–126)
Anion gap: 15 (ref 5–15)
BUN: 10 mg/dL (ref 8–23)
CO2: 26 mmol/L (ref 22–32)
Calcium: 9.7 mg/dL (ref 8.9–10.3)
Chloride: 67 mmol/L — ABNORMAL LOW (ref 98–111)
Creatinine, Ser: 0.95 mg/dL (ref 0.44–1.00)
GFR calc Af Amer: >60 mL/min (ref >60)
GFR calc non Af Amer: >60 mL/min (ref >60)
Glucose, Bld: 103 mg/dL — ABNORMAL HIGH (ref 70–99)
Potassium: 3.4 mmol/L — ABNORMAL LOW (ref 3.5–5.1)
Sodium: 108 mmol/L — CL (ref 135–145)
Total Bilirubin: 0.9 mg/dL (ref 0.3–1.2)
Total Protein: 8.2 g/dL — ABNORMAL HIGH (ref 6.5–8.1)

## 2019-05-29 LAB — TSH: TSH: 1.9 u[IU]/mL (ref 0.350–4.500)

## 2019-05-29 LAB — URINALYSIS, ROUTINE W REFLEX MICROSCOPIC
Bacteria, UA: NONE SEEN
Bilirubin Urine: NEGATIVE
Glucose, UA: NEGATIVE mg/dL
Ketones, ur: NEGATIVE mg/dL
Nitrite: NEGATIVE
Protein, ur: NEGATIVE mg/dL
Specific Gravity, Urine: 1.004 — ABNORMAL LOW (ref 1.005–1.030)
pH: 7 (ref 5.0–8.0)

## 2019-05-29 LAB — CBC
HCT: 32.2 % — ABNORMAL LOW (ref 36.0–46.0)
Hemoglobin: 11.9 g/dL — ABNORMAL LOW (ref 12.0–15.0)
MCH: 32.1 pg (ref 26.0–34.0)
MCHC: 37 g/dL — ABNORMAL HIGH (ref 30.0–36.0)
MCV: 86.8 fL (ref 80.0–100.0)
Platelets: 252 10*3/uL (ref 150–400)
RBC: 3.71 MIL/uL — ABNORMAL LOW (ref 3.87–5.11)
RDW: 12 % (ref 11.5–15.5)
WBC: 7.7 10*3/uL (ref 4.0–10.5)
nRBC: 0 % (ref 0.0–0.2)

## 2019-05-29 LAB — APTT: aPTT: 32 seconds (ref 24–36)

## 2019-05-29 LAB — GLUCOSE, CAPILLARY: Glucose-Capillary: 101 mg/dL — ABNORMAL HIGH (ref 70–99)

## 2019-05-29 LAB — PROTIME-INR
INR: 0.9 (ref 0.8–1.2)
Prothrombin Time: 11.7 seconds (ref 11.4–15.2)

## 2019-05-29 LAB — OSMOLALITY: Osmolality: 221 mOsm/kg — CL (ref 275–295)

## 2019-05-29 LAB — OSMOLALITY, URINE: Osmolality, Ur: 162 mOsm/kg — ABNORMAL LOW (ref 300–900)

## 2019-05-29 LAB — MAGNESIUM: Magnesium: 1.5 mg/dL — ABNORMAL LOW (ref 1.7–2.4)

## 2019-05-29 MED ORDER — LOSARTAN POTASSIUM 50 MG PO TABS
100.0000 mg | ORAL_TABLET | Freq: Every day | ORAL | Status: DC
  Administered 2019-05-29 – 2019-05-30 (×2): 100 mg via ORAL
  Filled 2019-05-29 (×2): qty 2

## 2019-05-29 MED ORDER — VITAMIN D 25 MCG (1000 UNIT) PO TABS
1000.0000 [IU] | ORAL_TABLET | Freq: Every day | ORAL | Status: DC
  Administered 2019-05-30 – 2019-05-31 (×2): 1000 [IU] via ORAL
  Filled 2019-05-29 (×2): qty 1

## 2019-05-29 MED ORDER — SODIUM CHLORIDE 0.9 % IV BOLUS
1000.0000 mL | Freq: Once | INTRAVENOUS | Status: AC
  Administered 2019-05-29: 1000 mL via INTRAVENOUS

## 2019-05-29 MED ORDER — DEXTROSE 5 % IV SOLN: INTRAVENOUS | Status: DC

## 2019-05-29 MED ORDER — NICOTINE 14 MG/24HR TD PT24
14.0000 mg | MEDICATED_PATCH | Freq: Every day | TRANSDERMAL | Status: DC
  Administered 2019-05-29 – 2019-05-30 (×2): 14 mg via TRANSDERMAL
  Filled 2019-05-29 (×2): qty 1

## 2019-05-29 MED ORDER — CALCIUM GLUCONATE-NACL 1-0.675 GM/50ML-% IV SOLN
1.0000 g | Freq: Once | INTRAVENOUS | Status: AC
  Administered 2019-05-29: 21:00:00 1000 mg via INTRAVENOUS
  Filled 2019-05-29: qty 50

## 2019-05-29 MED ORDER — POTASSIUM CHLORIDE CRYS ER 20 MEQ PO TBCR
40.0000 meq | EXTENDED_RELEASE_TABLET | ORAL | Status: AC
  Administered 2019-05-29 – 2019-05-30 (×2): 40 meq via ORAL
  Filled 2019-05-29 (×2): qty 2

## 2019-05-29 MED ORDER — ONDANSETRON HCL 4 MG/2ML IJ SOLN: 4.0000 mg | Freq: Four times a day (QID) | INTRAMUSCULAR | Status: DC | PRN

## 2019-05-29 MED ORDER — ENOXAPARIN SODIUM 40 MG/0.4ML ~~LOC~~ SOLN
40.0000 mg | SUBCUTANEOUS | Status: DC
  Administered 2019-05-30 (×2): 40 mg via SUBCUTANEOUS
  Filled 2019-05-29 (×2): qty 0.4

## 2019-05-29 MED ORDER — CLONAZEPAM 0.5 MG PO TABS
0.5000 mg | ORAL_TABLET | Freq: Two times a day (BID) | ORAL | Status: DC | PRN
  Administered 2019-05-30 – 2019-05-31 (×3): 0.5 mg via ORAL
  Filled 2019-05-29 (×3): qty 1

## 2019-05-29 MED ORDER — ONDANSETRON HCL 4 MG PO TABS: 4.0000 mg | ORAL_TABLET | Freq: Four times a day (QID) | ORAL | Status: DC | PRN

## 2019-05-29 MED ORDER — ATORVASTATIN CALCIUM 20 MG PO TABS
40.0000 mg | ORAL_TABLET | Freq: Every day | ORAL | Status: DC
  Administered 2019-05-30: 21:00:00 40 mg via ORAL
  Filled 2019-05-29: qty 2

## 2019-05-29 MED ORDER — ACETAMINOPHEN 325 MG PO TABS: 650.0000 mg | ORAL_TABLET | Freq: Four times a day (QID) | ORAL | Status: DC | PRN

## 2019-05-29 MED ORDER — SODIUM CHLORIDE 0.9% FLUSH
3.0000 mL | Freq: Two times a day (BID) | INTRAVENOUS | Status: DC
  Administered 2019-05-30 – 2019-05-31 (×4): 3 mL via INTRAVENOUS

## 2019-05-29 MED ORDER — POTASSIUM CHLORIDE CRYS ER 20 MEQ PO TBCR: 40.0000 meq | EXTENDED_RELEASE_TABLET | ORAL | Status: DC

## 2019-05-29 MED ORDER — THIAMINE HCL 100 MG PO TABS
100.0000 mg | ORAL_TABLET | Freq: Every day | ORAL | Status: DC
  Administered 2019-05-29 – 2019-05-31 (×3): 100 mg via ORAL
  Filled 2019-05-29 (×3): qty 1

## 2019-05-29 MED ORDER — ASPIRIN EC 81 MG PO TBEC
81.0000 mg | DELAYED_RELEASE_TABLET | Freq: Every day | ORAL | Status: DC
  Administered 2019-05-30 – 2019-05-31 (×2): 81 mg via ORAL
  Filled 2019-05-29 (×2): qty 1

## 2019-05-29 MED ORDER — COSYNTROPIN 0.25 MG IJ SOLR
0.2500 mg | Freq: Once | INTRAMUSCULAR | Status: AC
  Administered 2019-05-30: 0.25 mg via INTRAVENOUS
  Filled 2019-05-29 (×2): qty 0.25

## 2019-05-29 MED ORDER — ACETAMINOPHEN 650 MG RE SUPP: 650.0000 mg | Freq: Four times a day (QID) | RECTAL | Status: DC | PRN

## 2019-05-29 MED ORDER — POLYETHYLENE GLYCOL 3350 17 G PO PACK
17.0000 g | PACK | Freq: Every day | ORAL | Status: DC
  Administered 2019-05-29 – 2019-05-30 (×2): 17 g via ORAL
  Filled 2019-05-29 (×2): qty 1

## 2019-05-29 NOTE — ED Notes (Signed)
Pt called in the [place] and is not visualized, attempted to call the pt and husband from contact with no answer at this time.

## 2019-05-29 NOTE — ED Notes (Signed)
Lab called critical Na+ of 111 Admitting provider notified via message

## 2019-05-29 NOTE — H&P (Addendum)
History and Physical    Erin Lewis [id number] DOB: December 28, 1955 DOA: 05/29/2019  PCP: Casilda Carls, MD   Patient coming from: Home  I have personally briefly reviewed patient's old medical records in [place]  Chief Complaint:  dizziness  HPI: Erin Lewis is a 64 y.o. female with medical history significant of hypertension, history of alcohol and substance abuse, anxiety and depression was brought to ED with dizziness and generalized weakness.  According to patient she was feeling very dizzy with occasional diplopia for the past 2 days.  Dizziness were more described as lightheadedness with occasionally feeling of room spinning.  It is persistent and increased with postural change.  She was feeling very weak for the past few days, decreased appetite but increased thirst resulted in drinking a lot of water only.  Patient was recently started on Lexapro and Trileptal few days ago, last dose was yesterday.  Patient chronically takes HCTZ and losartan for hypertension.  She did endorse some nausea but no vomiting.  No headache, but some blurry vision for the past few days.  No chest pain, upper respiratory symptoms, palpitations, fever or chills or sick contacts.  Denies any urinary urgency or hesitancy.  No history of increased urinary frequency.  Patient with history of constipation, last bowel movement was 3 days ago.  No diarrhea.  Patient she does not drink regularly now.  Last drink was approximately 2 weeks ago.  Denies any current substance abuse.  Smokes half pack per day.  ED Course: On arrival patient was hemodynamically stable with elevated blood pressure.  Labs positive for sodium of 108, potassium of 3.4.  CT head unremarkable.  She was given 1 L of normal saline without any hyponatremia labs.   Review of Systems: As per HPI otherwise 10 point review of systems negative.   Past Medical History:  Diagnosis Date  . Hyperlipidemia   . Hypertension     Past  Surgical History:  Procedure Laterality Date  . [facility]  . CESAREAN SECTION     x2  . COLONOSCOPY WITH PROPOFOL N/A 07/20/2016   Procedure: COLONOSCOPY WITH PROPOFOL;  Surgeon: Robert Bellow, MD;  Location: [facility] ENDOSCOPY;  Service: Endoscopy;  Laterality: N/A;  . laproscopy     abdomen  . SHOULDER ARTHROSCOPY W/ ROTATOR CUFF REPAIR     x2  . TOTAL SHOULDER ARTHROPLASTY Right 6-7 years ago     reports that she has been smoking cigarettes. She has been smoking about 0.50 packs per day. She has never used smokeless tobacco. She reports current alcohol use. She reports that she does not use drugs.  No Known Allergies  Family History  Problem Relation Age of Onset  . Hypertension Other   . Diabetes Other   . Colon polyps Father 36  . Breast cancer Neg Hx     Prior to Admission medications   Medication Sig Start Date End Date Taking? Authorizing Provider  atorvastatin (LIPITOR) 20 MG tablet Take 20 mg by mouth daily. 09/05/18   [provider]  cholecalciferol (VITAMIN D3) 25 MCG (1000 UT) tablet Take 1 tablet (1,000 Units total) by mouth daily. 05/23/18   Clapacs, Madie Reno, MD  clonazePAM (KLONOPIN) 0.5 MG tablet Take 0.5 mg by mouth 2 (two) times daily.  05/05/18   [provider]  cyproheptadine (PERIACTIN) 4 MG tablet Take 4 mg by mouth daily. 03/23/15   [provider]  hydrOXYzine (ATARAX/VISTARIL) 25 MG  tablet Take 25 mg by mouth every 4 (four) hours as needed for sleep. 12/26/16   [provider]  levocetirizine (XYZAL) 5 MG tablet Take 5 mg by mouth daily. 05/23/18   [provider]  losartan-hydrochlorothiazide (HYZAAR) 100-25 MG tablet Take 1 tablet by mouth daily. 09/05/18   [provider]  traZODone (DESYREL) 100 MG tablet Take 1 tablet (100 mg total) by mouth at bedtime as needed for sleep. Patient not taking: Reported on 10/25/2018 05/22/18   Clapacs, Madie Reno, MD  vitamin B-12 1000 MCG  tablet Take 1 tablet (1,000 mcg total) by mouth daily. 05/23/18   Clapacs, Madie Reno, MD    Physical Exam: Vitals:   05/29/19 1700 05/29/19 1720 05/29/19 1730 05/29/19 1736  BP: (!) 162/84 (!) 186/75    Pulse:   65 69  Resp: (!) 22 19 12 18   Temp:      TempSrc:      SpO2:   100% 100%  Weight:      Height:        General: Vital signs reviewed.  Patient is well-developed and well-nourished, in no acute distress and cooperative with exam.  Head: Normocephalic and atraumatic. Eyes: EOMI, conjunctivae normal, no scleral icterus.  ENMT: Mucous membranes are moist.  Neck: Supple, trachea midline, normal ROM, no JVD, masses, thyromegaly, or carotid bruit present.  Cardiovascular: RRR, S1 normal, S2 normal, no murmurs, gallops, or rubs. Pulmonary/Chest: Clear to auscultation bilaterally, no wheezes, rales, or rhonchi. Abdominal: Soft, non-tender, non-distended, BS +, no masses, organomegaly, or guarding present.  Extremities: No lower extremity edema bilaterally,  pulses symmetric and intact bilaterally. No cyanosis or clubbing. Neurological: A&O x3, Strength is normal and symmetric bilaterally, cranial nerve II-XII are grossly intact, no focal motor deficit, sensory intact to light touch bilaterally.  Skin: Warm, dry and intact. No rashes or erythema. Psychiatric: Normal mood and affect.  Labs on Admission: I have personally reviewed following labs and imaging studies  CBC: Recent Labs  Lab 05/29/19 1356  WBC 7.7  NEUTROABS 5.6  HGB 11.9*  HCT 32.2*  MCV 86.8  PLT [id number]   Basic Metabolic Panel: Recent Labs  Lab 05/29/19 1356  NA 108*  K 3.4*  CL 67*  CO2 26  GLUCOSE 103*  BUN 10  CREATININE 0.95  CALCIUM 9.7   GFR: Estimated Creatinine Clearance: 54.5 mL/min (by C-G formula based on SCr of 0.95 mg/dL). Liver Function Tests: Recent Labs  Lab 05/29/19 1356  AST 36  ALT 22  ALKPHOS 69  BILITOT 0.9  PROT 8.2*  ALBUMIN 4.9   No results for input(s): LIPASE, AMYLASE in  the last 168 hours. No results for input(s): AMMONIA in the last 168 hours. Coagulation Profile: Recent Labs  Lab 05/29/19 1356  INR 0.9   Cardiac Enzymes: No results for input(s): CKTOTAL, CKMB, CKMBINDEX, TROPONINI in the last 168 hours. BNP (last 3 results) No results for input(s): PROBNP in the last 8760 hours. HbA1C: No results for input(s): HGBA1C in the last 72 hours. CBG: Recent Labs  Lab 05/29/19 1401  GLUCAP 101*   Lipid Profile: No results for input(s): CHOL, HDL, LDLCALC, TRIG, CHOLHDL, LDLDIRECT in the last 72 hours. Thyroid Function Tests: No results for input(s): TSH, T4TOTAL, FREET4, T3FREE, THYROIDAB in the last 72 hours. Anemia Panel: No results for input(s): VITAMINB12, FOLATE, FERRITIN, TIBC, IRON, RETICCTPCT in the last 72 hours. Urine analysis:    Component Value Date/Time   COLORURINE STRAW (A) 05/29/2019 1747   APPEARANCEUR  CLEAR (A) 05/29/2019 1747   APPEARANCEUR Clear 03/21/2014 1413   LABSPEC 1.004 (L) 05/29/2019 1747   LABSPEC 1.001 03/21/2014 1413   PHURINE 7.0 05/29/2019 1747   GLUCOSEU NEGATIVE 05/29/2019 1747   GLUCOSEU Negative 03/21/2014 1413   HGBUR SMALL (A) 05/29/2019 1747   BILIRUBINUR NEGATIVE 05/29/2019 1747   BILIRUBINUR Negative 03/21/2014 1413   KETONESUR NEGATIVE 05/29/2019 1747   PROTEINUR NEGATIVE 05/29/2019 1747   NITRITE NEGATIVE 05/29/2019 1747   LEUKOCYTESUR MODERATE (A) 05/29/2019 1747   LEUKOCYTESUR Negative 03/21/2014 1413    Radiological Exams on Admission: CT HEAD WO CONTRAST  Result Date: 05/29/2019 CLINICAL DATA:  Possible stroke. Patient woke up dizzy this morning. Also complaining of headache. EXAM: CT HEAD WITHOUT CONTRAST TECHNIQUE: Contiguous axial images were obtained from the base of the skull through the vertex without intravenous contrast. COMPARISON:  03/11/2016 FINDINGS: Brain: No evidence of acute infarction, hemorrhage, hydrocephalus, extra-axial collection or mass lesion/mass effect. Vascular: No  hyperdense vessel or unexpected calcification. Skull: Normal. Negative for fracture or focal lesion. Sinuses/Orbits: Globes and orbits are unremarkable. Visualized sinuses and mastoid air cells are clear. Other: None. IMPRESSION: Normal unenhanced CT scan of the brain. Electronically Signed   By: Lajean Manes M.D.   On: 05/29/2019 14:38   DG Chest Portable 1 View  Result Date: 05/29/2019 CLINICAL DATA:  Dizziness for several hours EXAM: PORTABLE CHEST 1 VIEW COMPARISON:  08/20/2018 FINDINGS: Single frontal view of the chest demonstrates a stable cardiac silhouette. No airspace disease, effusion, or pneumothorax. No acute bony abnormalities. Stable right shoulder arthroplasty. IMPRESSION: 1. No acute intrathoracic process. Electronically Signed   By: Randa Ngo M.D.   On: 05/29/2019 17:22    EKG: Independently reviewed.  Normal sinus rhythm.  Assessment/Plan Active Problems:   Hyponatremia   Severe hyponatremia.  Patient meets criteria for severe hyponatremia with sodium of 108.  CT head normal, no headache but some blurry vision. Patient is high risk for cerebral edema or seizure. Patient has multiple risk factors for hyponatremia, recently started on Lexapro and Trileptal, taking HCTZ chronically, drinking a lot of water for the past couple of days due to increased thirst. -Admit to stepdown. -Consult PCCM and nephrology. -Check BMP every 2 hourly for 4 reading and then every 4 hourly, goal is to increase sodium 8-10 in 24 hours.  If started correcting fast she will need to be on  D5. -Check hyponatremia labs which include serum and urine osmolality, urine sodium. -Check TSH -A.m. cortisol with ACTH stim test tomorrow. -Hold Lexapro, Trileptal and HCTZ.  Hypertension.  Blood pressure elevated. -Continue with home dose of losartan 100 mg daily. -Hydralazine IV 10 mg as needed for systolic above 0000000.  Anxiety/depression. -Continue clonazepam as needed. -Hold recently started Lexapro  and Trileptal as they can contribute to hyponatremia.  History of alcohol abuse.  Patient denies daily use of alcohol.  Last use was approximately 2 weeks ago.  Denies any withdrawal symptoms. -Thiamine p.o. -CIWA protocol without Ativan.  Dyslipidemia. -Continue statin.  Tobacco abuse. -Nicotine patch.  DVT prophylaxis: Lovenox Code Status: Full code Family Communication: Discussed with patient. Disposition Plan: Pending improvement in her sodium level. Consults called: PCCM, nephrology Admission status: Inpatient   Lorella Nimrod MD [facility]  If 7PM-7AM, please contact night-coverage [email or web address]  05/29/2019, 6:06 PM   This record has been created using Systems analyst. Errors have been sought and corrected,but may not always be located. Such creation errors do not reflect on the standard of  care.

## 2019-05-29 NOTE — ED Provider Notes (Signed)
[facility] Emergency Department Provider Note    First MD Initiated Contact with Patient 05/29/19 1638     (approximate)  I have reviewed the triage vital signs and the nursing notes.   HISTORY  Chief Complaint Dizziness    HPI Erin Lewis is a 64 y.o. female presents to the ER for generalized dizziness started roughly 24 hours ago. Has been feeling a little bit more weak over the past several days. Just recently started Trileptal and Lexapro on the 15th. She denies any chest pain or shortness of breath. Has not lost consciousness. Denies any headache. Feels lightheaded whenever she stands up.    Past Medical History:  Diagnosis Date  . Hyperlipidemia   . Hypertension    Family History  Problem Relation Age of Onset  . Hypertension Other   . Diabetes Other   . Colon polyps Father 65  . Breast cancer Neg Hx    Past Surgical History:  Procedure Laterality Date  . [facility]BD[facility]  . CESAREAN SECTION     x2  . COLONOSCOPY WITH PROPOFOL N/A 07/20/2016   Procedure: COLONOSCOPY WITH PROPOFOL;  Surgeon: Robert Bellow, MD;  Location: [facility] ENDOSCOPY;  Service: Endoscopy;  Laterality: N/A;  . laproscopy     abdomen  . SHOULDER ARTHROSCOPY W/ ROTATOR CUFF REPAIR     x2  . TOTAL SHOULDER ARTHROPLASTY Right 6-7 years ago   Patient Active Problem List   Diagnosis Date Noted  . Alcohol use disorder, moderate, dependence ([place]) 05/20/2018  . Recurrent major depression-severe ([place]) 05/20/2018  . Rectal bleeding 07/11/2016  . Sensory deficit, left 03/11/2016  . HTN (hypertension) 03/11/2016  . Expressive aphasia 03/11/2016  . Weakness of left lower extremity 03/11/2016  . Major depressive disorder, recurrent, severe without psychotic features ([place]) 10/07/2014  . Anxiety reaction 10/07/2014      Prior to Admission medications   Medication Sig Start Date End Date Taking? Authorizing Provider  atorvastatin (LIPITOR)  20 MG tablet Take 20 mg by mouth daily. 09/05/18   [provider]  cholecalciferol (VITAMIN D3) 25 MCG (1000 UT) tablet Take 1 tablet (1,000 Units total) by mouth daily. 05/23/18   Clapacs, Madie Reno, MD  clonazePAM (KLONOPIN) 0.5 MG tablet Take 0.5 mg by mouth 2 (two) times daily.  05/05/18   [provider]  cyproheptadine (PERIACTIN) 4 MG tablet Take 4 mg by mouth daily. 03/23/15   [provider]  hydrOXYzine (ATARAX/VISTARIL) 25 MG tablet Take 25 mg by mouth every 4 (four) hours as needed for sleep. 12/26/16   [provider]  levocetirizine (XYZAL) 5 MG tablet Take 5 mg by mouth daily. 05/23/18   [provider]  losartan-hydrochlorothiazide (HYZAAR) 100-25 MG tablet Take 1 tablet by mouth daily. 09/05/18   [provider]  traZODone (DESYREL) 100 MG tablet Take 1 tablet (100 mg total) by mouth at bedtime as needed for sleep. Patient not taking: Reported on 10/25/2018 05/22/18   Clapacs, Madie Reno, MD  vitamin B-12 1000 MCG tablet Take 1 tablet (1,000 mcg total) by mouth daily. 05/23/18   Clapacs, Madie Reno, MD    Allergies Patient has no known allergies.    Social History Social History   Tobacco Use  . Smoking status: Current Every Day Smoker    Packs/day: 0.50    Types: Cigarettes  . Smokeless tobacco: Never Used  Substance Use Topics  . Alcohol use: Yes    Comment: occasionally  .  Drug use: No    Review of Systems Patient denies headaches, rhinorrhea, blurry vision, numbness, shortness of breath, chest pain, edema, cough, abdominal pain, nausea, vomiting, diarrhea, dysuria, fevers, rashes or hallucinations unless otherwise stated above in HPI. ____________________________________________   PHYSICAL EXAM:  VITAL SIGNS: Vitals:   05/29/19 1633 05/29/19 1637  BP: (!) 164/94 (!) 148/68  Pulse: 66 64  Resp: 20 19  Temp: 98.6 F (37 C)   SpO2: 99% 100%    Constitutional: Alert and oriented.  Eyes: Conjunctivae are normal.    Head: Atraumatic. Nose: No congestion/rhinnorhea. Mouth/Throat: Mucous membranes are moist.   Neck: No stridor. Painless ROM.  Cardiovascular: Normal rate, regular rhythm. Grossly normal heart sounds.  Good peripheral circulation. Respiratory: Normal respiratory effort.  No retractions. Lungs CTAB. Gastrointestinal: Soft and nontender. No distention. No abdominal bruits. No CVA tenderness. Genitourinary:  Musculoskeletal: No lower extremity tenderness nor edema.  No joint effusions. Neurologic:  Normal speech and language. No gross focal neurologic deficits are appreciated. No facial droop Skin:  Skin is warm, dry and intact. No rash noted. Psychiatric: Mood and affect are normal. Speech and behavior are normal.  ____________________________________________   LABS (all labs ordered are listed, but only abnormal results are displayed)  Results for orders placed or performed during the hospital encounter of 05/29/19 (from the past 24 hour(s))  Protime-INR     Status: None   Collection Time: 05/29/19  1:56 PM  Result Value Ref Range   Prothrombin Time 11.7 11.4 - 15.2 seconds   INR 0.9 0.8 - 1.2  APTT     Status: None   Collection Time: 05/29/19  1:56 PM  Result Value Ref Range   aPTT 32 24 - 36 seconds  CBC     Status: Abnormal   Collection Time: 05/29/19  1:56 PM  Result Value Ref Range   WBC 7.7 4.0 - 10.5 K/uL   RBC 3.71 (L) 3.87 - 5.11 MIL/uL   Hemoglobin 11.9 (L) 12.0 - 15.0 g/dL   HCT 32.2 (L) 36.0 - 46.0 %   MCV 86.8 80.0 - 100.0 fL   MCH 32.1 26.0 - 34.0 pg   MCHC 37.0 (H) 30.0 - 36.0 g/dL   RDW 12.0 11.5 - 15.5 %   Platelets 252 150 - 400 K/uL   nRBC 0.0 0.0 - 0.2 %  Differential     Status: None   Collection Time: 05/29/19  1:56 PM  Result Value Ref Range   Neutrophils Relative % 74 %   Neutro Abs 5.6 1.7 - 7.7 K/uL   Lymphocytes Relative 16 %   Lymphs Abs 1.4 0.7 - 4.0 K/uL   Monocytes Relative 10 %   Monocytes Absolute 0.7 0.1 - 1.0 K/uL   Eosinophils  Relative 0 %   Eosinophils Absolute 0.0 0.0 - 0.5 K/uL   Basophils Relative 0 %   Basophils Absolute 0.0 0.0 - 0.1 K/uL   Immature Granulocytes 0 %   Abs Immature Granulocytes 0.00 0.00 - 0.07 K/uL  Comprehensive metabolic panel     Status: Abnormal   Collection Time: 05/29/19  1:56 PM  Result Value Ref Range   Sodium 108 (LL) 135 - 145 mmol/L   Potassium 3.4 (L) 3.5 - 5.1 mmol/L   Chloride 67 (L) 98 - 111 mmol/L   CO2 26 22 - 32 mmol/L   Glucose, Bld 103 (H) 70 - 99 mg/dL   BUN 10 8 - 23 mg/dL   Creatinine, Ser 0.95 0.44 - 1.00  mg/dL   Calcium 9.7 8.9 - 10.3 mg/dL   Total Protein 8.2 (H) 6.5 - 8.1 g/dL   Albumin 4.9 3.5 - 5.0 g/dL   AST 36 15 - 41 U/L   ALT 22 0 - 44 U/L   Alkaline Phosphatase 69 38 - 126 U/L   Total Bilirubin 0.9 0.3 - 1.2 mg/dL   GFR calc non Af Amer >60 >60 mL/min   GFR calc Af Amer >60 >60 mL/min   Anion gap 15 5 - 15  Glucose, capillary     Status: Abnormal   Collection Time: 05/29/19  2:01 PM  Result Value Ref Range   Glucose-Capillary 101 (H) 70 - 99 mg/dL   ____________________________________________  EKG My review and personal interpretation at Time: 13:54   Indication: dizziness  Rate: 70-  Rhythm: sinus Axis: normal Other: normal intervals, no stemi ____________________________________________  RADIOLOGY  I personally reviewed all radiographic images ordered to evaluate for the above acute complaints and reviewed radiology reports and findings.  These findings were personally discussed with the patient.  Please see medical record for radiology report.  ____________________________________________   PROCEDURES  Procedure(s) performed:  .Critical Care Performed by: Merlyn Lot, MD Authorized by: Merlyn Lot, MD   Critical care provider statement:    Critical care time (minutes):  35   Critical care time was exclusive of:  Separately billable procedures and treating other patients   Critical care was necessary to treat or  prevent imminent or life-threatening deterioration of the following conditions:  Metabolic crisis   Critical care was time spent personally by me on the following activities:  Development of treatment plan with patient or surrogate, discussions with consultants, evaluation of patient's response to treatment, examination of patient, obtaining history from patient or surrogate, ordering and performing treatments and interventions, ordering and review of laboratory studies, ordering and review of radiographic studies, pulse oximetry, re-evaluation of patient's condition and review of old charts      Critical Care performed: yes ____________________________________________   INITIAL IMPRESSION / ASSESSMENT AND PLAN / ED COURSE  Pertinent labs & imaging results that were available during my care of the patient were reviewed by me and considered in my medical decision making (see chart for details).   DDX:  Electrolyte abn, anemia, mass, dehydration, medication effect  Erin Lewis is a 64 y.o. who presents to the ED with symptoms as described above.  Blood work sent for above differential does show evidence of acute significant hyponatremia.  Her neuro exam is intact at this time.  CT head without any edema.  Suspect this may be medication related.  Will give IV fluids.  Will require hospitalization for further medical management and work-up.  The patient will be placed on continuous pulse oximetry and telemetry for monitoring.  Have discussed with the patient and available family all diagnostics and treatments performed thus far and all questions were answered to the best of my ability. The patient demonstrates understanding and agreement with plan.      The patient was evaluated in Emergency Department today for the symptoms described in the history of present illness. He/she was evaluated in the context of the global COVID-19 pandemic, which necessitated consideration that the patient might  be at risk for infection with the SARS-CoV-2 virus that causes COVID-19. Institutional protocols and algorithms that pertain to the evaluation of patients at risk for COVID-19 are in a state of rapid change based on information released by regulatory bodies including the CDC  and federal and state organizations. These policies and algorithms were followed during the patient's care in the ED.  As part of my medical decision making, I reviewed the following data within the [facility] notes reviewed and incorporated, Labs reviewed, notes from prior ED visits and [place] Controlled Substance Database   ____________________________________________   FINAL CLINICAL IMPRESSION(S) / ED DIAGNOSES  Final diagnoses:  Dizziness  Acute hyponatremia      NEW MEDICATIONS STARTED DURING THIS VISIT:  New Prescriptions   No medications on file     Note:  This document was prepared using Dragon voice recognition software and may include unintentional dictation errors.    Merlyn Lot, MD 05/29/19 [phone]

## 2019-05-29 NOTE — ED Triage Notes (Addendum)
Pt presents to ED via POV with c/o dizziness x several hours. Pt states had just gotten out of bed when dizziness started. Pt states got out of bed and became dizzy. Upon arrival to ED pt hesitant to follow commands regarding facial symmetry and drift, pt with equal grip strengths bilaterally and to the best of this RN assessment pt with facial symmetry intact. VSS upon arrival in triage. Pt states "I'm so dizzy, I feel so sick, it's just been like this all day". Pt A&O x4, no slurred speech.   Pt also reports, "I haven't been peeing like I ought too, even though I been drinking".

## 2019-05-29 NOTE — ED Notes (Signed)
report called to teresa rn.  Pt moved to cpod room 31 while waiting for admission bed.

## 2019-05-29 NOTE — ED Notes (Signed)
Pt to treatment room via wheelchair.  Pt reports dizziness for 2 days.  Pt denies chest pain or sob.  Pt reports vomiting after eating meals for 3 days.  No abd pain.  Pt alert speech clear.

## 2019-05-29 NOTE — ED Notes (Signed)
Urine to lab.  Pt alert  nsr on monitor.

## 2019-05-30 DIAGNOSIS — E871 Hypo-osmolality and hyponatremia: Principal | ICD-10-CM

## 2019-05-30 LAB — MRSA PCR SCREENING: MRSA by PCR: NEGATIVE

## 2019-05-30 LAB — BASIC METABOLIC PANEL
Anion gap: 11 (ref 5–15)
Anion gap: 13 (ref 5–15)
Anion gap: 10 (ref 5–15)
Anion gap: 11 (ref 5–15)
Anion gap: 10 (ref 5–15)
Anion gap: 9 (ref 5–15)
BUN: 7 mg/dL — ABNORMAL LOW (ref 8–23)
BUN: 8 mg/dL (ref 8–23)
BUN: 7 mg/dL — ABNORMAL LOW (ref 8–23)
BUN: 9 mg/dL (ref 8–23)
BUN: 16 mg/dL (ref 8–23)
BUN: 15 mg/dL (ref 8–23)
CO2: 26 mmol/L (ref 22–32)
CO2: 22 mmol/L (ref 22–32)
CO2: 23 mmol/L (ref 22–32)
CO2: 22 mmol/L (ref 22–32)
CO2: 21 mmol/L — ABNORMAL LOW (ref 22–32)
CO2: 22 mmol/L (ref 22–32)
Calcium: 9.3 mg/dL (ref 8.9–10.3)
Calcium: 9.3 mg/dL (ref 8.9–10.3)
Calcium: 9.3 mg/dL (ref 8.9–10.3)
Calcium: 9.4 mg/dL (ref 8.9–10.3)
Calcium: 9.4 mg/dL (ref 8.9–10.3)
Calcium: 9.3 mg/dL (ref 8.9–10.3)
Chloride: 72 mmol/L — ABNORMAL LOW (ref 98–111)
Chloride: 78 mmol/L — ABNORMAL LOW (ref 98–111)
Chloride: 81 mmol/L — ABNORMAL LOW (ref 98–111)
Chloride: 84 mmol/L — ABNORMAL LOW (ref 98–111)
Chloride: 87 mmol/L — ABNORMAL LOW (ref 98–111)
Chloride: 90 mmol/L — ABNORMAL LOW (ref 98–111)
Creatinine, Ser: 0.9 mg/dL (ref 0.44–1.00)
Creatinine, Ser: 0.74 mg/dL (ref 0.44–1.00)
Creatinine, Ser: 0.87 mg/dL (ref 0.44–1.00)
Creatinine, Ser: 0.93 mg/dL (ref 0.44–1.00)
Creatinine, Ser: 1.17 mg/dL — ABNORMAL HIGH (ref 0.44–1.00)
Creatinine, Ser: 1.04 mg/dL — ABNORMAL HIGH (ref 0.44–1.00)
GFR calc Af Amer: >60 mL/min (ref >60)
GFR calc Af Amer: >60 mL/min (ref >60)
GFR calc Af Amer: >60 mL/min (ref >60)
GFR calc Af Amer: >60 mL/min (ref >60)
GFR calc Af Amer: 57 mL/min — ABNORMAL LOW (ref >60)
GFR calc Af Amer: >60 mL/min (ref >60)
GFR calc non Af Amer: >60 mL/min (ref >60)
GFR calc non Af Amer: >60 mL/min (ref >60)
GFR calc non Af Amer: >60 mL/min (ref >60)
GFR calc non Af Amer: >60 mL/min (ref >60)
GFR calc non Af Amer: 50 mL/min — ABNORMAL LOW (ref >60)
GFR calc non Af Amer: 57 mL/min — ABNORMAL LOW (ref >60)
Glucose, Bld: 235 mg/dL — ABNORMAL HIGH (ref 70–99)
Glucose, Bld: 82 mg/dL (ref 70–99)
Glucose, Bld: 230 mg/dL — ABNORMAL HIGH (ref 70–99)
Glucose, Bld: 123 mg/dL — ABNORMAL HIGH (ref 70–99)
Glucose, Bld: 158 mg/dL — ABNORMAL HIGH (ref 70–99)
Glucose, Bld: 122 mg/dL — ABNORMAL HIGH (ref 70–99)
Potassium: 3.2 mmol/L — ABNORMAL LOW (ref 3.5–5.1)
Potassium: 3.8 mmol/L (ref 3.5–5.1)
Potassium: 4 mmol/L (ref 3.5–5.1)
Potassium: 4.3 mmol/L (ref 3.5–5.1)
Potassium: 4.1 mmol/L (ref 3.5–5.1)
Potassium: 4.3 mmol/L (ref 3.5–5.1)
Sodium: 109 mmol/L — CL (ref 135–145)
Sodium: 113 mmol/L — CL (ref 135–145)
Sodium: 114 mmol/L — CL (ref 135–145)
Sodium: 117 mmol/L — CL (ref 135–145)
Sodium: 118 mmol/L — CL (ref 135–145)
Sodium: 121 mmol/L — ABNORMAL LOW (ref 135–145)

## 2019-05-30 LAB — HIV ANTIBODY (ROUTINE TESTING W REFLEX): HIV Screen 4th Generation wRfx: NONREACTIVE

## 2019-05-30 LAB — HEMOGLOBIN A1C
Hgb A1c MFr Bld: 5.3 % (ref 4.8–5.6)
Mean Plasma Glucose: 105.41 mg/dL

## 2019-05-30 LAB — MAGNESIUM: Magnesium: 2.5 mg/dL — ABNORMAL HIGH (ref 1.7–2.4)

## 2019-05-30 LAB — GLUCOSE, CAPILLARY: Glucose-Capillary: 96 mg/dL (ref 70–99)

## 2019-05-30 LAB — ACTH STIMULATION, 3 TIME POINTS
Cortisol, 30 Min: 29.6 ug/dL
Cortisol, 60 Min: 33 ug/dL
Cortisol, Base: 12.7 ug/dL

## 2019-05-30 LAB — SARS CORONAVIRUS 2 (TAT 6-24 HRS): SARS Coronavirus 2: NEGATIVE

## 2019-05-30 MED ORDER — ADULT MULTIVITAMIN W/MINERALS CH
1.0000 | ORAL_TABLET | Freq: Every day | ORAL | Status: DC
  Administered 2019-05-30 – 2019-05-31 (×2): 1 via ORAL
  Filled 2019-05-30 (×2): qty 1

## 2019-05-30 MED ORDER — MELATONIN 5 MG PO TABS: 5.0000 mg | ORAL_TABLET | Freq: Every day | ORAL | Status: DC

## 2019-05-30 MED ORDER — SODIUM CHLORIDE 0.9 % IV SOLN
1.0000 g | INTRAVENOUS | Status: DC
  Administered 2019-05-30: 04:00:00 1 g via INTRAVENOUS
  Filled 2019-05-30 (×2): qty 10

## 2019-05-30 MED ORDER — NOREPINEPHRINE 4 MG/250ML-% IV SOLN
INTRAVENOUS | Status: AC
  Filled 2019-05-30: qty 250

## 2019-05-30 MED ORDER — SODIUM CHLORIDE 0.9 % IV SOLN: INTRAVENOUS | Status: DC

## 2019-05-30 MED ORDER — MELATONIN 5 MG PO TABS
5.0000 mg | ORAL_TABLET | Freq: Every day | ORAL | Status: DC
  Administered 2019-05-30 (×2): 5 mg via ORAL
  Filled 2019-05-30 (×2): qty 1

## 2019-05-30 MED ORDER — FOLIC ACID 1 MG PO TABS
1.0000 mg | ORAL_TABLET | Freq: Every day | ORAL | Status: DC
  Administered 2019-05-30 – 2019-05-31 (×2): 1 mg via ORAL
  Filled 2019-05-30 (×2): qty 1

## 2019-05-30 MED ORDER — CHLORHEXIDINE GLUCONATE CLOTH 2 % EX PADS: 6.0000 | MEDICATED_PAD | Freq: Every day | CUTANEOUS | Status: DC

## 2019-05-30 MED ORDER — MAGNESIUM SULFATE 2 GM/50ML IV SOLN
2.0000 g | Freq: Once | INTRAVENOUS | Status: AC
  Administered 2019-05-30: 2 g via INTRAVENOUS
  Filled 2019-05-30: qty 50

## 2019-05-30 MED ORDER — HYDROXYZINE HCL 25 MG PO TABS
25.0000 mg | ORAL_TABLET | Freq: Four times a day (QID) | ORAL | Status: DC | PRN
  Administered 2019-05-30: 23:00:00 25 mg via ORAL
  Filled 2019-05-30 (×2): qty 1

## 2019-05-30 NOTE — Consult Note (Signed)
Name: Erin Lewis MRN: [id number] DOB: 1955-08-20    ADMISSION DATE:  05/29/2019 CONSULTATION DATE:  05/29/2019  REFERRING MD :  Dr. Reesa Chew  CHIEF COMPLAINT:  Dizziness and generalized weakness  BRIEF PATIENT DESCRIPTION:  64 y.o. Female with PMH of ETOH abuse, HTN, anxiety, depression admitted 3/24 due to severe Hypotonic Hyponatremia (Na 108), likely in setting of HCTZ,  Lexapro, Trileptal, and recent increased water intake.    SIGNIFICANT EVENTS  3/24: Presented to ED, given 1L NS with rapid correction of Na to 122 3/24:Placed on D5W; Nephrology and PCCM consulted  STUDIES:  3/24: CT Head>>Normal unenhanced CT scan of the brain. 3/24: CXR>>1. No acute intrathoracic process.  CULTURES: SARS-CoV-2 PCR 3/24>> Negative Urine 3/24>>  ANTIBIOTICS: Ceftriaxone 3/25>>  HISTORY OF PRESENT ILLNESS:   [name] is a 64 year old female with past medical history notable for hypertension, alcohol and substance abuse, anxiety and depression who presented to [facility] ED on 05/29/2019 with complaints of dizziness and generalized weakness.  She reports she has been feeling dizzy with occasional diplopia for the past 2 days, which has been persistent and increases with postural changes.  She reports her appetite has been decreased, however she has had increased thirst of which she has been drinking a lot of water only.  She was recently started on Lexapro and Trileptal approximately a week ago.  She takes hydrochlorothiazide and losartan chronically for hypertension.  She denies headache, chest pain, shortness of breath, abdominal pain, edema, fever, chills, sick contacts, urgency or hesitancy.  She reports her last alcoholic drink was approximately 2 weeks ago.  On presentation to the ED she was hemodynamically stable with hypertension.  Work-up revealed sodium 108, potassium 3.4.  CT head without contrast was unremarkable.  She was given 1 L of normal saline without any hyponatremia labs.  While  awaiting bed placement in the ED, her follow-up sodium had increased to 122.  She was subsequently placed on D5W infusion at 50 ml/hr. serum osmolality is 221, urine osmolality 162, urine sodium 35.  Chest x-ray is unremarkable.  Urinalysis positive for leukocytes and 21-50 WBCs concerning for UTI. She is admitted to the stepdown unit by the hospitalist for further work-up and treatment of severe hypotonic hyponatremia, likely in the setting of HCTZ, Lexapro, Trileptal, and recent increased water intake.  PCCM and nephrology have been consulted for further management.  PAST MEDICAL HISTORY :   has a past medical history of Hyperlipidemia and Hypertension.  has a past surgical history that includes Cesarean section; laproscopy; Shoulder arthroscopy w/ rotator cuff repair; Total shoulder arthroplasty (Right, 6-7 years ago); Abdominal hysterectomy; and Colonoscopy with propofol (N/A, 07/20/2016). Prior to Admission medications   Medication Sig Start Date End Date Taking? Authorizing Provider  aspirin EC 81 MG tablet Take 81 mg by mouth daily.   Yes [provider]  atorvastatin (LIPITOR) 40 MG tablet Take 40 mg by mouth daily.  09/05/18  Yes [provider]  cholecalciferol (VITAMIN D3) 25 MCG (1000 UT) tablet Take 1 tablet (1,000 Units total) by mouth daily. Patient taking differently: Take 1,000 Units by mouth every 14 (fourteen) days.  05/23/18  Yes Clapacs, Madie Reno, MD  clonazePAM (KLONOPIN) 0.5 MG tablet Take 0.5 mg by mouth 2 (two) times daily as needed for anxiety.  05/05/18  Yes [provider]  escitalopram (LEXAPRO) 5 MG tablet Take 5 mg by mouth daily. 05/20/19  Yes [provider]  hydrOXYzine (ATARAX/VISTARIL) 25 MG tablet Take 25 mg by  mouth every 4 (four) hours as needed for sleep. 12/26/16  Yes [provider]  losartan-hydrochlorothiazide (HYZAAR) 100-25 MG tablet Take 1 tablet by mouth daily. 09/05/18  Yes [provider]  oxcarbazepine  (TRILEPTAL) 600 MG tablet Take 300-600 mg by mouth See admin instructions. Take 1/2 tablet (300 mg) in the morning, and 1 tablet (600 mg) at bedtime 05/20/19  Yes [provider]   No Known Allergies  FAMILY HISTORY:  family history includes Colon polyps (age of onset: 56) in her father; Diabetes in an other family member; Hypertension in an other family member. SOCIAL HISTORY:  reports that she has been smoking cigarettes. She has been smoking about 0.50 packs per day. She has never used smokeless tobacco. She reports current alcohol use. She reports that she does not use drugs.   COVID-19 DISASTER DECLARATION:  FULL CONTACT PHYSICAL EXAMINATION WAS NOT POSSIBLE DUE TO TREATMENT OF COVID-19 AND  CONSERVATION OF PERSONAL PROTECTIVE EQUIPMENT, LIMITED EXAM FINDINGS INCLUDE-  Patient assessed or the symptoms described in the history of present illness.  In the context of the Global COVID-19 pandemic, which necessitated consideration that the patient might be at risk for infection with the SARS-CoV-2 virus that causes COVID-19, Institutional protocols and algorithms that pertain to the evaluation of patients at risk for COVID-19 are in a state of rapid change based on information released by regulatory bodies including the CDC and federal and state organizations. These policies and algorithms were followed during the patient's care while in hospital.  REVIEW OF SYSTEMS:  Positives in BOLD Constitutional: Negative for fever, chills, weight loss, +malaise/fatigue and diaphoresis.  HENT: Negative for hearing loss, ear pain, nosebleeds, congestion, sore throat, neck pain, tinnitus and ear discharge.   Eyes: Negative for blurred vision, double vision, photophobia, pain, discharge and redness.  Respiratory: Negative for cough, hemoptysis, sputum production, shortness of breath, wheezing and stridor.   Cardiovascular: Negative for chest pain, palpitations, orthopnea, claudication, leg swelling  and PND.  Gastrointestinal: Negative for heartburn, nausea, vomiting, abdominal pain, diarrhea, constipation, blood in stool and melena.  Genitourinary: Negative for dysuria, urgency, frequency, hematuria and flank pain.  Musculoskeletal: Negative for myalgias, back pain, joint pain and falls.  Skin: Negative for itching and rash.  Neurological: Negative for dizziness, tingling, tremors, sensory change, speech change, focal weakness, seizures, loss of consciousness, weakness and headaches.  Endo/Heme/Allergies: Negative for environmental allergies and polydipsia. Does not bruise/bleed easily.  SUBJECTIVE:  Pt reports "feeling really tired", reports dizziness has improved Denies chest pain, SOB, abdominal pain, N/V/D, fever/chills On room air  VITAL SIGNS: Temp:  [98.5 F (36.9 C)-98.6 F (37 C)] 98.6 F (37 C) (03/24 1633) Lewis Rate:  [61-69] 61 (03/24 2351) Resp:  [12-22] 14 (03/24 2351) BP: (148-186)/(68-94) 152/85 (03/24 2351) SpO2:  [99 %-100 %] 99 % (03/24 2351) Weight:  [66.7 kg] 66.7 kg (03/24 1354)  PHYSICAL EXAMINATION: General: Acutely ill-appearing female, laying in bed, on room air, no acute distress Neuro: Awake, alert and oriented x4, follows commands, no focal deficits, speech clear HEENT: Atraumatic, normocephalic, neck supple, no JVD, pupils PERRLA Cardiovascular: Regular rate and rhythm, S1-S2, no murmurs, rubs, gallops, 2+ pulses Lungs: Clear to auscultation bilaterally, no wheezing or rales noted, even, nonlabored, normal effort Abdomen: Soft, nontender, nondistended, no guarding or rebound tenderness, bowel sounds positive x4 Musculoskeletal: Normal bulk and tone, no deformities, no edema Skin: Warm and dry, no obvious rashes, lesions, ulcerations  Recent Labs  Lab 05/29/19 1650 05/29/19 2230 05/30/19 0120  NA  122* 111* 109*  K 2.2* 3.6 3.2*  CL 96* 75* 72*  CO2 21* 25 26  BUN 7* 8 7*  CREATININE 0.54 0.83 0.90  GLUCOSE 66* 91 235*   Recent Labs    Lab 05/29/19 1356  HGB 11.9*  HCT 32.2*  WBC 7.7  PLT 252   CT HEAD WO CONTRAST  Result Date: 05/29/2019 CLINICAL DATA:  Possible stroke. Patient woke up dizzy this morning. Also complaining of headache. EXAM: CT HEAD WITHOUT CONTRAST TECHNIQUE: Contiguous axial images were obtained from the base of the skull through the vertex without intravenous contrast. COMPARISON:  03/11/2016 FINDINGS: Brain: No evidence of acute infarction, hemorrhage, hydrocephalus, extra-axial collection or mass lesion/mass effect. Vascular: No hyperdense vessel or unexpected calcification. Skull: Normal. Negative for fracture or focal lesion. Sinuses/Orbits: Globes and orbits are unremarkable. Visualized sinuses and mastoid air cells are clear. Other: None. IMPRESSION: Normal unenhanced CT scan of the brain. Electronically Signed   By: Lajean Manes M.D.   On: 05/29/2019 14:38   DG Chest Portable 1 View  Result Date: 05/29/2019 CLINICAL DATA:  Dizziness for several hours EXAM: PORTABLE CHEST 1 VIEW COMPARISON:  08/20/2018 FINDINGS: Single frontal view of the chest demonstrates a stable cardiac silhouette. No airspace disease, effusion, or pneumothorax. No acute bony abnormalities. Stable right shoulder arthroplasty. IMPRESSION: 1. No acute intrathoracic process. Electronically Signed   By: Randa Ngo M.D.   On: 05/29/2019 17:22    ASSESSMENT / PLAN:  -Severe HYPOTONIC HYPONATREMIA w/ Urine Na 35, secondary to HZTZ, Lexapro, Trileptil & increased water intake last few days due to increased thirst -Frequent Neuro checks as pt is high risk for ODS & Seizures -CT Head 3/24 negative for edema -Follow serum Na q2h x4, then q4h -Goal correction is to increase serum Na 4 to 6 mEq in 24 hrs (max rate or correction is 8 mEq in 24h) -Received 1L of Normal Saline in ED with increase in Na to 122 -Was placed on D5W due to rapid correction ~ Na now back to 109, discussed with Dr. Lucile Shutters of [facility], will d/c D5W and place on NS  @50  ml/hr with frequent serum Na checks as above -TSH 1.9 -A.M. Cortisol with ACTH stimulation test later this morning -Hold HCTZ, Lexpro, Trileptal -Nephrology consulted, appreciate input  Hypokalemia Hypomagnesemia -Monitor I&O's / urinary output -Follow BMP -Ensure adequate renal perfusion -Avoid nephrotoxic agents as able -Replace electrolytes as indicated  UTI -Monitor fever curve -Trend WBC's -Follow cultures as above -Place on Ceftriaxone  Hypertension -Continuous cardiac monitoring -Maintain MAP >65 -Continue home Losartan -PRN Hydralazine for SBP >160  Anxiety/Depression -Continue Clonazepam -Holding recently started Lexapro & Trileptal as suspect they are cause of Hyponatremia  Hx of ETOH Abuse  -Reports last use was approximately 2 weeks ago -CIWA protocol -PO Thiamine         DISPOSITION: Stepdown GOALS OF CARE: Full Code VTE PROPHYLAXIS: SQ Lovenox UPDATES: Updated pt at bedside 05/30/19 CONSULTS: Hospitalist (primary), Nephrology  Darel Hong, AGACNP-BC [place] Pager: [phone]  05/30/2019, 2:27 AM

## 2019-05-30 NOTE — ED Notes (Addendum)
Pt assisted to toilet to void 

## 2019-05-30 NOTE — ED Notes (Signed)
Pt asisted to toilet to void

## 2019-05-30 NOTE — Progress Notes (Addendum)
PROGRESS NOTE    Erin Lewis  [id number] DOB: [birth year] DOA: 05/29/2019 PCP: Casilda Carls, MD   Brief Narrative:  Erin Lewis is a 64 y.o. female with medical history significant of hypertension, history of alcohol and substance abuse, anxiety and depression was brought to ED with dizziness and generalized weakness. Found to have severe hyponatremia with sodium of 108 most likely secondary to recently started Lexapro, Trileptal and drinking a lot of water.  Patient was on HCTZ chronically.  Subjective: Patient was feeling little better when seen today.  She appears anxious, stating that she was unable to sleep last night.  She was accompanied by her son in the room.  Dizziness seems improving.  No chest pain or shortness of breath.  Vision improved than yesterday.  Assessment & Plan:   Active Problems:   Acute hyponatremia  Severe hypotonic hyponatremia.  Patient meets criteria for severe hyponatremia with sodium of 108.  CT head normal.  Most likely secondary to recently started Lexapro, Trileptal and taking HCTZ for a long time.  She was also not drinking a lot of water due to increased feeling of thirst since starting the new medications. Overnight sodium increased significantly to 122 as she received 1 L of NS in ED. She was initially given some daily 5 with decrease in sodium to 109.  Then she was started on normal saline at 50 mL/h which resulted in appropriate increase in sodium.  Last BMP with sodium of 114.  Symptom seems improving. TSH, a.m. cortisol and ACTH stimulation test are all within normal limit. -Continue to monitor in stepdown with BMP every 4 hourly. -Continue normal saline at 50 mL/h-will adjust accordingly. -Goal rise in sodium is 8-10 in 24-hour. -Counseled patient that she should not take Lexapro, Trileptal and HCTZ now. Can be started on SSRI if needed with just one medicine and close monitoring by PCP. -Nephrology and PCCM is following.   Hypertension.  Blood pressure within goal. -Continue home dose of losartan. -Discontinue HCTZ-she can be started on a second agent if needed, preferably amlodipine.  Intermittent hyperglycemia.  Her blood glucose level was in 200s intermittently.  No prior diagnosis of diabetes.  Patient did receive D5 due to overcorrection of sodium.  Recent complaints of polydipsia. -Checking A1c  Anxiety/depression. -Continue clonazepam as needed. -Hold recently started Lexapro and Trileptal as they can contribute to hyponatremia.  History of alcohol abuse.  Patient denies daily use of alcohol.  Last use was approximately 2 weeks ago.  Denies any withdrawal symptoms. -Thiamine p.o. -CIWA protocol without Ativan.  Dyslipidemia. -Continue statin.  Tobacco abuse. -Nicotine patch.  Objective: Vitals:   05/30/19 0500 05/30/19 0600 05/30/19 0800 05/30/19 1000  BP: 119/70 122/73 121/78 112/64  Pulse: 63 64 61 79  Resp: 12 16 12 20   Temp:   97.7 F (36.5 C)   TempSrc:   Oral   SpO2: 99% 99% 100% 100%  Weight:      Height:        Intake/Output Summary (Last 24 hours) at 05/30/2019 1254 Last data filed at 05/30/2019 1000 Gross per 24 hour  Intake 1437.71 ml  Output 400 ml  Net 1037.71 ml   Filed Weights   05/29/19 1354 05/30/19 0300  Weight: 66.7 kg 66.1 kg    Examination:  General exam: Well-developed lady, in no acute distress. Respiratory system: Clear to auscultation. Respiratory effort normal. Cardiovascular system: S1 & S2 heard, RRR. No JVD, murmurs, rubs, gallops or clicks. Gastrointestinal system: Soft,  nontender, nondistended, bowel sounds positive. Central nervous system: Alert and oriented. No focal neurological deficits.Symmetric 5 x 5 power. Extremities: No edema, no cyanosis, pulses intact and symmetrical. Skin: No rashes, lesions or ulcers Psychiatry: Judgement and insight appear normal.    DVT prophylaxis: Lovenox Code Status: Full Family Communication: Son was  updated at bedside. Disposition Plan: Pending improvement in her sodium.  Will go back home in 1 to 2 days.  Consultants:   PCCM  Nephrology  Procedures:  Antimicrobials:   Data Reviewed: I have personally reviewed following labs and imaging studies  CBC: Recent Labs  Lab 05/29/19 1356  WBC 7.7  NEUTROABS 5.6  HGB 11.9*  HCT 32.2*  MCV 86.8  PLT [id number]   Basic Metabolic Panel: Recent Labs  Lab 05/29/19 1650 05/29/19 2230 05/30/19 0120 05/30/19 0602 05/30/19 0929  NA 122* 111* 109* 113* 114*  K 2.2* 3.6 3.2* 3.8 4.0  CL 96* 75* 72* 78* 81*  CO2 21* 25 26 22 23   GLUCOSE 66* 91 235* 82 230*  BUN 7* 8 7* 8 7*  CREATININE 0.54 0.83 0.90 0.74 0.87  CALCIUM 5.9* 10.4* 9.3 9.3 9.3  MG  --  1.5*  --  2.5*  --    GFR: Estimated Creatinine Clearance: 62 mL/min (by C-G formula based on SCr of 0.87 mg/dL). Liver Function Tests: Recent Labs  Lab 05/29/19 1356  AST 36  ALT 22  ALKPHOS 69  BILITOT 0.9  PROT 8.2*  ALBUMIN 4.9   No results for input(s): LIPASE, AMYLASE in the last 168 hours. No results for input(s): AMMONIA in the last 168 hours. Coagulation Profile: Recent Labs  Lab 05/29/19 1356  INR 0.9   Cardiac Enzymes: No results for input(s): CKTOTAL, CKMB, CKMBINDEX, TROPONINI in the last 168 hours. BNP (last 3 results) No results for input(s): PROBNP in the last 8760 hours. HbA1C: No results for input(s): HGBA1C in the last 72 hours. CBG: Recent Labs  Lab 05/29/19 1401 05/30/19 0224  GLUCAP 101* 96   Lipid Profile: No results for input(s): CHOL, HDL, LDLCALC, TRIG, CHOLHDL, LDLDIRECT in the last 72 hours. Thyroid Function Tests: Recent Labs    05/29/19 1650  TSH 1.900   Anemia Panel: No results for input(s): VITAMINB12, FOLATE, FERRITIN, TIBC, IRON, RETICCTPCT in the last 72 hours. Sepsis Labs: No results for input(s): PROCALCITON, LATICACIDVEN in the last 168 hours.  Recent Results (from the past 240 hour(s))  SARS CORONAVIRUS 2 (TAT  6-24 HRS) Nasopharyngeal Nasopharyngeal Swab     Status: None   Collection Time: 05/29/19  4:50 PM   Specimen: Nasopharyngeal Swab  Result Value Ref Range Status   SARS Coronavirus 2 NEGATIVE NEGATIVE Final    Comment: (NOTE) SARS-CoV-2 target nucleic acids are NOT DETECTED. The SARS-CoV-2 RNA is generally detectable in upper and lower respiratory specimens during the acute phase of infection. Negative results do not preclude SARS-CoV-2 infection, do not rule out co-infections with other pathogens, and should not be used as the sole basis for treatment or other patient management decisions. Negative results must be combined with clinical observations, patient history, and epidemiological information. The expected result is Negative. Fact Sheet for Patients: SugarRoll.be Fact Sheet for Healthcare Providers: https://www.woods-mathews.com/ This test is not yet approved or cleared by the [place] FDA and  has been authorized for detection and/or diagnosis of SARS-CoV-2 by FDA under an Emergency Use Authorization (EUA). This EUA will remain  in effect (meaning this test can be used) for the duration of  the COVID-19 declaration under Section 56 4(b)(1) of the Act, 21 U.S.C. section 360bbb-3(b)(1), unless the authorization is terminated or revoked sooner. Performed at [facility], [place] [address], [place], [place] [address]   MRSA PCR Screening     Status: None   Collection Time: 05/30/19  3:00 AM   Specimen: Nasal Mucosa; Nasopharyngeal  Result Value Ref Range Status   MRSA by PCR NEGATIVE NEGATIVE Final    Comment:        The GeneXpert MRSA Assay (FDA approved for NASAL specimens only), is one component of a comprehensive MRSA colonization surveillance program. It is not intended to diagnose MRSA infection nor to guide or monitor treatment for MRSA infections. Performed at [facility], [address],  [place], [place] [address]      Radiology Studies: CT HEAD WO CONTRAST  Result Date: 05/29/2019 CLINICAL DATA:  Possible stroke. Patient woke up dizzy this morning. Also complaining of headache. EXAM: CT HEAD WITHOUT CONTRAST TECHNIQUE: Contiguous axial images were obtained from the base of the skull through the vertex without intravenous contrast. COMPARISON:  03/11/2016 FINDINGS: Brain: No evidence of acute infarction, hemorrhage, hydrocephalus, extra-axial collection or mass lesion/mass effect. Vascular: No hyperdense vessel or unexpected calcification. Skull: Normal. Negative for fracture or focal lesion. Sinuses/Orbits: Globes and orbits are unremarkable. Visualized sinuses and mastoid air cells are clear. Other: None. IMPRESSION: Normal unenhanced CT scan of the brain. Electronically Signed   By: Lajean Manes M.D.   On: 05/29/2019 14:38   DG Chest Portable 1 View  Result Date: 05/29/2019 CLINICAL DATA:  Dizziness for several hours EXAM: PORTABLE CHEST 1 VIEW COMPARISON:  08/20/2018 FINDINGS: Single frontal view of the chest demonstrates a stable cardiac silhouette. No airspace disease, effusion, or pneumothorax. No acute bony abnormalities. Stable right shoulder arthroplasty. IMPRESSION: 1. No acute intrathoracic process. Electronically Signed   By: Randa Ngo M.D.   On: 05/29/2019 17:22    Scheduled Meds: . aspirin EC  81 mg Oral Daily  . atorvastatin  40 mg Oral Daily  . Chlorhexidine Gluconate Cloth  6 each Topical Daily  . cholecalciferol  1,000 Units Oral Daily  . enoxaparin (LOVENOX) injection  40 mg Subcutaneous Q24H  . folic acid  1 mg Oral Daily  . losartan  100 mg Oral Daily  . melatonin  5 mg Oral QHS  . multivitamin with minerals  1 tablet Oral Daily  . nicotine  14 mg Transdermal Daily  . polyethylene glycol  17 g Oral Daily  . sodium chloride flush  3 mL Intravenous Q12H  . thiamine  100 mg Oral Daily   Continuous Infusions: . sodium chloride 50 mL/hr at 05/30/19 1000   . norepinephrine       LOS: 1 day   Time spent: 40 minutes.  Lorella Nimrod, MD [facility]  If 7PM-7AM, please contact night-coverage [email or web address]  05/30/2019, 12:54 PM   This record has been created using Systems analyst. Errors have been sought and corrected,but may not always be located. Such creation errors do not reflect on the standard of care.

## 2019-05-30 NOTE — ED Notes (Addendum)
Pharmacy called to send lipitor IV fluids placed on puomp d/t not running in timely manner

## 2019-05-30 NOTE — ED Notes (Signed)
Pt assisted to toilet to void 

## 2019-05-30 NOTE — Progress Notes (Signed)
CRITICAL VALUE ALERT  Critical Value: Sodium =109  Date & Time Notied: 05/30/19 02:17  Provider Notified: Sharion Settler, NP  Orders Received/Actions taken: no

## 2019-05-30 NOTE — Consult Note (Signed)
[address] [place], [place] [address] Phone [phone]. Fax [phone]  Date: 05/30/2019                  Patient Name:  Erin Lewis  MRN: [id number]  DOB: 1955/07/30  Age / Sex: 64 y.o., female         PCP: Casilda Carls, MD                 Service Requesting Consult: IM/ Lorella Nimrod, MD                 Reason for Consult: Hyponatremia            History of Present Illness: Patient is a 64 y.o. female with medical problems of hypertension and hyperlipidemia, who was admitted to [facility] on 05/29/2019 for evaluation of dizziness for several hours prior to arrival.  Patient reports she was started on Lexapro and oxcarbazepine (Trileptal) about a week prior to admission.  She had nausea and vomiting at home after starting the new medications but she got over it.  Her oral intake has been very poor.  Patient is also noted to be on losartan/HCTZ for hypertension.  She reports she was so thirsty and she was drinking large amounts of water at home  Upon admission, she was noted to have critically low sodium of 108.  Previously known mildly low sodium of 133 back in August 2020  This morning, she was able to eat a small amount of breakfast.  She is not as thirsty anymore.  Sodium level is improving with IV normal saline infusion.  She is not nauseous.  States she is able to void without any difficulties.  Patient reports she smokes a little bit but denies alcohol use recently.  Medications: Outpatient medications: Medications Prior to Admission  Medication Sig Dispense Refill Last Dose  . aspirin EC 81 MG tablet Take 81 mg by mouth daily.   05/29/2019 at 0830  . atorvastatin (LIPITOR) 40 MG tablet Take 40 mg by mouth daily.    05/28/2019 at Unknown time  . cholecalciferol (VITAMIN D3) 25 MCG (1000 UT) tablet Take 1 tablet (1,000 Units total) by mouth daily. (Patient taking differently: Take 1,000 Units by mouth every 14 (fourteen) days. ) 30 tablet 1 05/29/2019 at 0830   . clonazePAM (KLONOPIN) 0.5 MG tablet Take 0.5 mg by mouth 2 (two) times daily as needed for anxiety.    prn at prn  . escitalopram (LEXAPRO) 5 MG tablet Take 5 mg by mouth daily.   05/28/2019 at 0830  . hydrOXYzine (ATARAX/VISTARIL) 25 MG tablet Take 25 mg by mouth every 4 (four) hours as needed for sleep.   prn at prn  . losartan-hydrochlorothiazide (HYZAAR) 100-25 MG tablet Take 1 tablet by mouth daily.   05/29/2019 at 0830  . oxcarbazepine (TRILEPTAL) 600 MG tablet Take 300-600 mg by mouth See admin instructions. Take 1/2 tablet (300 mg) in the morning, and 1 tablet (600 mg) at bedtime   05/28/2019 at 0830    Current medications: Current Facility-Administered Medications  Medication Dose Route Frequency Provider Last Rate Last Admin  . 0.9 %  sodium chloride infusion   Intravenous Continuous Darel Hong D, NP 50 mL/hr at 05/30/19 0412 New Bag at 05/30/19 0412  . acetaminophen (TYLENOL) tablet 650 mg  650 mg Oral Q6H PRN Lorella Nimrod, MD       Or  . acetaminophen (TYLENOL) suppository 650 mg  650 mg Rectal Q6H PRN Lorella Nimrod,  MD      . aspirin EC tablet 81 mg  81 mg Oral Daily Lorella Nimrod, MD      . atorvastatin (LIPITOR) tablet 40 mg  40 mg Oral Daily Lorella Nimrod, MD      . cefTRIAXone (ROCEPHIN) 1 g in sodium chloride 0.9 % 100 mL IVPB  1 g Intravenous Q24H Darel Hong D, NP 200 mL/hr at 05/30/19 0339 1 g at 05/30/19 0339  . Chlorhexidine Gluconate Cloth 2 % PADS 6 each  6 each Topical Daily Lorella Nimrod, MD      . cholecalciferol (VITAMIN D3) tablet 1,000 Units  1,000 Units Oral Daily Lorella Nimrod, MD      . clonazePAM (KLONOPIN) tablet 0.5 mg  0.5 mg Oral BID PRN Lorella Nimrod, MD      . enoxaparin (LOVENOX) injection 40 mg  40 mg Subcutaneous Q24H Lorella Nimrod, MD   40 mg at 05/30/19 0022  . losartan (COZAAR) tablet 100 mg  100 mg Oral Daily Lorella Nimrod, MD   100 mg at 05/29/19 2029  . melatonin tablet 5 mg  5 mg Oral QHS Sharion Settler, NP   5 mg at 05/30/19 [id number]   . nicotine (NICODERM CQ - dosed in mg/24 hours) patch 14 mg  14 mg Transdermal Daily Lorella Nimrod, MD   14 mg at 05/29/19 2029  . norepinephrine (LEVOPHED) 4-5 MG/250ML-% infusion SOLN           . ondansetron (ZOFRAN) tablet 4 mg  4 mg Oral Q6H PRN Lorella Nimrod, MD       Or  . ondansetron (ZOFRAN) injection 4 mg  4 mg Intravenous Q6H PRN Lorella Nimrod, MD      . polyethylene glycol (MIRALAX / GLYCOLAX) packet 17 g  17 g Oral Daily Lorella Nimrod, MD   17 g at 05/29/19 2028  . sodium chloride flush (NS) 0.9 % injection 3 mL  3 mL Intravenous Q12H Lorella Nimrod, MD   3 mL at 05/30/19 0257  . thiamine tablet 100 mg  100 mg Oral Daily Lorella Nimrod, MD   100 mg at 05/29/19 2029      Allergies: No Known Allergies    Past Medical History: Past Medical History:  Diagnosis Date  . Hyperlipidemia   . Hypertension      Past Surgical History: Past Surgical History:  Procedure Laterality Date  . [facility]  . CESAREAN SECTION     x2  . COLONOSCOPY WITH PROPOFOL N/A 07/20/2016   Procedure: COLONOSCOPY WITH PROPOFOL;  Surgeon: Robert Bellow, MD;  Location: [facility] ENDOSCOPY;  Service: Endoscopy;  Laterality: N/A;  . laproscopy     abdomen  . SHOULDER ARTHROSCOPY W/ ROTATOR CUFF REPAIR     x2  . TOTAL SHOULDER ARTHROPLASTY Right 6-7 years ago     Family History: Family History  Problem Relation Age of Onset  . Hypertension Other   . Diabetes Other   . Colon polyps Father 21  . Breast cancer Neg Hx      Social History: Social History   Socioeconomic History  . Marital status: Married    Spouse name: Not on file  . Number of children: Not on file  . Years of education: Not on file  . Highest education level: Not on file  Occupational History  . Not on file  Tobacco Use  . Smoking status: Current Every Day Smoker    Packs/day: 0.50    Types: Cigarettes  .  Smokeless tobacco: Never Used  Substance and Sexual Activity  . Alcohol use: Yes     Comment: occasionally  . Drug use: No  . Sexual activity: Not on file  Other Topics Concern  . Not on file  Social History Narrative  . Not on file   Social Determinants of Health   Financial Resource Strain:   . Difficulty of Paying Living Expenses:   Food Insecurity:   . Worried About Charity fundraiser in the Last Year:   . Arboriculturist in the Last Year:   Transportation Needs:   . Film/video editor (Medical):   Marland Kitchen Lack of Transportation (Non-Medical):   Physical Activity:   . Days of Exercise per Week:   . Minutes of Exercise per Session:   Stress:   . Feeling of Stress :   Social Connections:   . Frequency of Communication with Friends and Family:   . Frequency of Social Gatherings with Friends and Family:   . Attends Religious Services:   . Active Member of Clubs or Organizations:   . Attends Archivist Meetings:   Marland Kitchen Marital Status:   Intimate Partner Violence:   . Fear of Current or Ex-Partner:   . Emotionally Abused:   Marland Kitchen Physically Abused:   . Sexually Abused:      Review of Systems: Gen: Generalized weakness HEENT: No blurry vision, headaches at this time CV: No chest pain or shortness of breath Respiratory: No cough or sputum production GI: Nausea and vomiting at home.  Poor appetite at home.  Getting better GU : Denies any problems with voiding MS: Generalized weakness Derm:    No complaints Psych: No complaint Heme: No complaints Neuro: Felt dizziness at home prior to arrival.  Feeling much better now Endocrine.  No complaints  Vital Signs: Blood pressure 122/73, pulse 64, temperature 98.1 F (36.7 C), temperature source Oral, resp. rate 16, height 5\' 6"  (1.676 m), weight 66.1 kg, SpO2 99 %.   Intake/Output Summary (Last 24 hours) at 05/30/2019 0859 Last data filed at 05/30/2019 [id number] Gross per 24 hour  Intake 1000 ml  Output 400 ml  Net 600 ml    Weight trends: [employer]   05/29/19 1354 05/30/19 0300  Weight: 66.7  kg 66.1 kg    Physical Exam: General:  No acute distress, laying in the bed  HEENT  anicteric, moist oral mucous membrane  Neck:  Supple, no masses  Lungs:  Normal breathing effort, clear to auscultation  Heart::  Regular rate, no rub or gallop  Abdomen:  Soft, nontender  Extremities:  No peripheral edema  Neurologic:  Alert, oriented  Skin:  No acute rashes    Lab results: Basic Metabolic Panel: Recent Labs  Lab 05/29/19 2230 05/30/19 0120 05/30/19 0602  NA 111* 109* 113*  K 3.6 3.2* 3.8  CL 75* 72* 78*  CO2 25 26 22   GLUCOSE 91 235* 82  BUN 8 7* 8  CREATININE 0.83 0.90 0.74  CALCIUM 10.4* 9.3 9.3  MG 1.5*  --  2.5*    Liver Function Tests: Recent Labs  Lab 05/29/19 1356  AST 36  ALT 22  ALKPHOS 69  BILITOT 0.9  PROT 8.2*  ALBUMIN 4.9   No results for input(s): LIPASE, AMYLASE in the last 168 hours. No results for input(s): AMMONIA in the last 168 hours.  CBC: Recent Labs  Lab 05/29/19 1356  WBC 7.7  NEUTROABS 5.6  HGB 11.9*  HCT 32.2*  MCV 86.8  PLT 252    Cardiac Enzymes: No results for input(s): CKTOTAL, TROPONINI in the last 168 hours.  BNP: Invalid input(s): POCBNP  CBG: Recent Labs  Lab 05/29/19 1401 05/30/19 0224  GLUCAP 101* 96    Microbiology: Recent Results (from the past 720 hour(s))  SARS CORONAVIRUS 2 (TAT 6-24 HRS) Nasopharyngeal Nasopharyngeal Swab     Status: None   Collection Time: 05/29/19  4:50 PM   Specimen: Nasopharyngeal Swab  Result Value Ref Range Status   SARS Coronavirus 2 NEGATIVE NEGATIVE Final    Comment: (NOTE) SARS-CoV-2 target nucleic acids are NOT DETECTED. The SARS-CoV-2 RNA is generally detectable in upper and lower respiratory specimens during the acute phase of infection. Negative results do not preclude SARS-CoV-2 infection, do not rule out co-infections with other pathogens, and should not be used as the sole basis for treatment or other patient management decisions. Negative results must be  combined with clinical observations, patient history, and epidemiological information. The expected result is Negative. Fact Sheet for Patients: SugarRoll.be Fact Sheet for Healthcare Providers: https://www.woods-mathews.com/ This test is not yet approved or cleared by the [place] FDA and  has been authorized for detection and/or diagnosis of SARS-CoV-2 by FDA under an Emergency Use Authorization (EUA). This EUA will remain  in effect (meaning this test can be used) for the duration of the COVID-19 declaration under Section 56 4(b)(1) of the Act, 21 U.S.C. section 360bbb-3(b)(1), unless the authorization is terminated or revoked sooner. Performed at [facility], [place] [address] [place], [place] [address]   MRSA PCR Screening     Status: None   Collection Time: 05/30/19  3:00 AM   Specimen: Nasal Mucosa; Nasopharyngeal  Result Value Ref Range Status   MRSA by PCR NEGATIVE NEGATIVE Final    Comment:        The GeneXpert MRSA Assay (FDA approved for NASAL specimens only), is one component of a comprehensive MRSA colonization surveillance program. It is not intended to diagnose MRSA infection nor to guide or monitor treatment for MRSA infections. Performed at [facility], [address] [place], [place] [address]      Coagulation Studies: Recent Labs    05/29/19 1356  LABPROT 11.7  INR 0.9    Urinalysis: Recent Labs    05/29/19 1747  COLORURINE STRAW*  LABSPEC 1.004*  PHURINE 7.0  GLUCOSEU NEGATIVE  HGBUR SMALL*  BILIRUBINUR NEGATIVE  KETONESUR NEGATIVE  PROTEINUR NEGATIVE  NITRITE NEGATIVE  LEUKOCYTESUR MODERATE*        Imaging: CT HEAD WO CONTRAST  Result Date: 05/29/2019 CLINICAL DATA:  Possible stroke. Patient woke up dizzy this morning. Also complaining of headache. EXAM: CT HEAD WITHOUT CONTRAST TECHNIQUE: Contiguous axial images were obtained from the base of the skull through the  vertex without intravenous contrast. COMPARISON:  03/11/2016 FINDINGS: Brain: No evidence of acute infarction, hemorrhage, hydrocephalus, extra-axial collection or mass lesion/mass effect. Vascular: No hyperdense vessel or unexpected calcification. Skull: Normal. Negative for fracture or focal lesion. Sinuses/Orbits: Globes and orbits are unremarkable. Visualized sinuses and mastoid air cells are clear. Other: None. IMPRESSION: Normal unenhanced CT scan of the brain. Electronically Signed   By: Lajean Manes M.D.   On: 05/29/2019 14:38   DG Chest Portable 1 View  Result Date: 05/29/2019 CLINICAL DATA:  Dizziness for several hours EXAM: PORTABLE CHEST 1 VIEW COMPARISON:  08/20/2018 FINDINGS: Single frontal view of the chest demonstrates a stable cardiac silhouette. No airspace disease, effusion, or pneumothorax. No  acute bony abnormalities. Stable right shoulder arthroplasty. IMPRESSION: 1. No acute intrathoracic process. Electronically Signed   By: Randa Ngo M.D.   On: 05/29/2019 17:22      Assessment & Plan: Pt is a 64 y.o. African-American  female with hypertension, hyperlipidemia, was admitted on 05/29/2019 with Acute hyponatremia [E87.1] Dizziness [R42] Hyponatremia [E87.1]  1.  Acute hyponatremia  likely multifactorial, likely contribution from hydrochlorothiazide, SSRI Lexapro, nausea and vomiting Work-up so far shows normal TSH Chest x-ray negative for any acute intrathoracic process Goal for correction of sodium is 8 mEq in 24 hours Sodium level has improved to 113 today Continue to monitor every 4 hours Discontinue hydrochlorothiazide and do not restart outpatient       LOS: 1 Erin Lewis 3/25/20218:59 AM    Note: This note was prepared with Dragon dictation. Any transcription errors are unintentional

## 2019-05-31 LAB — BASIC METABOLIC PANEL
Anion gap: 7 (ref 5–15)
Anion gap: 9 (ref 5–15)
Anion gap: 10 (ref 5–15)
BUN: 15 mg/dL (ref 8–23)
BUN: 13 mg/dL (ref 8–23)
BUN: 12 mg/dL (ref 8–23)
CO2: 24 mmol/L (ref 22–32)
CO2: 25 mmol/L (ref 22–32)
CO2: 25 mmol/L (ref 22–32)
Calcium: 8.9 mg/dL (ref 8.9–10.3)
Calcium: 9.3 mg/dL (ref 8.9–10.3)
Calcium: 9.3 mg/dL (ref 8.9–10.3)
Chloride: 92 mmol/L — ABNORMAL LOW (ref 98–111)
Chloride: 93 mmol/L — ABNORMAL LOW (ref 98–111)
Chloride: 91 mmol/L — ABNORMAL LOW (ref 98–111)
Creatinine, Ser: 0.89 mg/dL (ref 0.44–1.00)
Creatinine, Ser: 1.06 mg/dL — ABNORMAL HIGH (ref 0.44–1.00)
Creatinine, Ser: 1.13 mg/dL — ABNORMAL HIGH (ref 0.44–1.00)
GFR calc Af Amer: >60 mL/min (ref >60)
GFR calc Af Amer: >60 mL/min (ref >60)
GFR calc Af Amer: 60 mL/min — ABNORMAL LOW (ref >60)
GFR calc non Af Amer: >60 mL/min (ref >60)
GFR calc non Af Amer: 56 mL/min — ABNORMAL LOW (ref >60)
GFR calc non Af Amer: 52 mL/min — ABNORMAL LOW (ref >60)
Glucose, Bld: 94 mg/dL (ref 70–99)
Glucose, Bld: 87 mg/dL (ref 70–99)
Glucose, Bld: 156 mg/dL — ABNORMAL HIGH (ref 70–99)
Potassium: 3.9 mmol/L (ref 3.5–5.1)
Potassium: 4 mmol/L (ref 3.5–5.1)
Potassium: 3.7 mmol/L (ref 3.5–5.1)
Sodium: 123 mmol/L — ABNORMAL LOW (ref 135–145)
Sodium: 127 mmol/L — ABNORMAL LOW (ref 135–145)
Sodium: 126 mmol/L — ABNORMAL LOW (ref 135–145)

## 2019-05-31 LAB — URINE CULTURE: Culture: NO GROWTH

## 2019-05-31 MED ORDER — LOSARTAN POTASSIUM 100 MG PO TABS: 100.0000 mg | ORAL_TABLET | Freq: Every day | ORAL | 0 refills | Status: AC

## 2019-05-31 NOTE — Discharge Summary (Signed)
Physician Discharge Summary  Secora Villegas [place] [id number] DOB: 1955-07-20 DOA: 05/29/2019  PCP: Casilda Carls, MD  Admit date: 05/29/2019 Discharge date: 05/31/2019  Admitted From: Home Disposition: Home   Recommendations for Outpatient Follow-up:  1. Follow up with PCP in 1 weeks 2. Please obtain BMP/CBC in one week or earlier. 3. Please follow up on the following pending results:None  Home Health:No Equipment/Devices:None Discharge Condition: Stable CODE STATUS: Full Diet recommendation: Heart Healthy   Brief/Interim Summary: Rhionna Giffin a 64 y.o.femalewith medical history significant ofhypertension, history of alcohol and substance abuse,anxiety and depression was brought to ED with dizziness and generalized weakness. Found to have severe hyponatremia with sodium of 108 most likely secondary to recently started Lexapro, Trileptal and drinking a lot of water.  Patient was on HCTZ chronically. She was monitored closely in ICU setting.  Labs were consistent with hypotonic hyponatremia.  TSH, a.m. cortisol and ACTH stim test were within normal limit. Overnight sodium increased significantly to 122 as she received 1 L of NS in ED. She was initially given some D 5 with decrease in sodium to 109.  Then she was started on normal saline at 50 mL/h which resulted in appropriate increase in sodium.  Continue to improve sodium even after stopping IV fluid and night before.  Her sodium on discharge was 127.  She was asked to follow-up with her primary care earlier next week for repeat labs.  We also discontinued her Lexapro, Trileptal and HCTZ.  Her blood pressure was well controlled on losartan while in the hospital and her PCP should be able to add another agent if needed.  Symptoms resolved.  Did had intermittent hyperglycemia with blood glucose in 200s, A1c checked was 5.3.  Her PCP can follow-up with her levels.  She also need to follow-up with her psychiatrist for her  anxiety/depression needs.  They should be able to start her on some other medication and monitor closely. Continue her home dose of clonazepam as needed.  Discharge Diagnoses:  Active Problems:   Acute hyponatremia  Discharge Instructions  Discharge Instructions    Diet - low sodium heart healthy   Complete by: As directed    Discharge instructions   Complete by: As directed    It was pleasure taking care of you. We stopped your new medications Lexapro, Trileptal and also one of your blood pressure medicine HCTZ.  Your home medicine was a combination of 2 medication called losartan and HCTZ, you are provided with a new prescription for losartan only.  Please follow-up with your primary care physician within next few days for further management of your blood pressure and your sodium level checked. You can follow-up with your psychiatrist for your depression and anxiety and let them know that you developed low sodium with those medications so they can adjust accordingly.   Increase activity slowly   Complete by: As directed      Allergies as of 05/31/2019   No Known Allergies     Medication List    STOP taking these medications   escitalopram 5 MG tablet Commonly known as: LEXAPRO   losartan-hydrochlorothiazide 100-25 MG tablet Commonly known as: HYZAAR   oxcarbazepine 600 MG tablet Commonly known as: TRILEPTAL     TAKE these medications   aspirin EC 81 MG tablet Take 81 mg by mouth daily.   atorvastatin 40 MG tablet Commonly known as: LIPITOR Take 40 mg by mouth daily.   cholecalciferol 25 MCG (1000 UNIT) tablet Commonly known as:  VITAMIN D3 Take 1 tablet (1,000 Units total) by mouth daily. What changed: when to take this   clonazePAM 0.5 MG tablet Commonly known as: KLONOPIN Take 0.5 mg by mouth 2 (two) times daily as needed for anxiety.   hydrOXYzine 25 MG tablet Commonly known as: ATARAX/VISTARIL Take 25 mg by mouth every 4 (four) hours as needed for sleep.    losartan 100 MG tablet Commonly known as: COZAAR Take 1 tablet (100 mg total) by mouth daily.      Follow-up Information    Casilda Carls, MD. Schedule an appointment as soon as possible for a visit.   Specialty: Internal Medicine Why: To be seen earlier next week for repeat BMP. Contact information: [place] [address] [phone]          No Known Allergies  Consultations:  Nephrology  PCCM  Procedures/Studies: CT HEAD WO CONTRAST  Result Date: 05/29/2019 CLINICAL DATA:  Possible stroke. Patient woke up dizzy this morning. Also complaining of headache. EXAM: CT HEAD WITHOUT CONTRAST TECHNIQUE: Contiguous axial images were obtained from the base of the skull through the vertex without intravenous contrast. COMPARISON:  03/11/2016 FINDINGS: Brain: No evidence of acute infarction, hemorrhage, hydrocephalus, extra-axial collection or mass lesion/mass effect. Vascular: No hyperdense vessel or unexpected calcification. Skull: Normal. Negative for fracture or focal lesion. Sinuses/Orbits: Globes and orbits are unremarkable. Visualized sinuses and mastoid air cells are clear. Other: None. IMPRESSION: Normal unenhanced CT scan of the brain. Electronically Signed   By: Lajean Manes M.D.   On: 05/29/2019 14:38   DG Chest Portable 1 View  Result Date: 05/29/2019 CLINICAL DATA:  Dizziness for several hours EXAM: PORTABLE CHEST 1 VIEW COMPARISON:  08/20/2018 FINDINGS: Single frontal view of the chest demonstrates a stable cardiac silhouette. No airspace disease, effusion, or pneumothorax. No acute bony abnormalities. Stable right shoulder arthroplasty. IMPRESSION: 1. No acute intrathoracic process. Electronically Signed   By: Randa Ngo M.D.   On: 05/29/2019 17:22     Subjective: Patient was feeling better when seen today.  Denies any more dizziness.  She was able to ambulate well.  Discharge Exam: Vitals:   05/31/19 0731 05/31/19 0900  BP:  (!) 96/39   Pulse:  76  Resp:  19  Temp: 98.1 F (36.7 C)   SpO2:  100%   Vitals:   05/31/19 0600 05/31/19 0700 05/31/19 0731 05/31/19 0900  BP: (!) 82/56 121/75  (!) 96/39  Pulse: (!) 57 70  76  Resp: 12 12  19   Temp:   98.1 F (36.7 C)   TempSrc:   Oral   SpO2: 97% 100%  100%  Weight:      Height:        General: Pt is alert, awake, not in acute distress Cardiovascular: RRR, S1/S2 +, no rubs, no gallops Respiratory: CTA bilaterally, no wheezing, no rhonchi Abdominal: Soft, NT, ND, bowel sounds + Extremities: no edema, no cyanosis   The results of significant diagnostics from this hospitalization (including imaging, microbiology, ancillary and laboratory) are listed below for reference.    Microbiology: Recent Results (from the past 240 hour(s))  SARS CORONAVIRUS 2 (TAT 6-24 HRS) Nasopharyngeal Nasopharyngeal Swab     Status: None   Collection Time: 05/29/19  4:50 PM   Specimen: Nasopharyngeal Swab  Result Value Ref Range Status   SARS Coronavirus 2 NEGATIVE NEGATIVE Final    Comment: (NOTE) SARS-CoV-2 target nucleic acids are NOT DETECTED. The SARS-CoV-2 RNA is generally detectable in upper and  lower respiratory specimens during the acute phase of infection. Negative results do not preclude SARS-CoV-2 infection, do not rule out co-infections with other pathogens, and should not be used as the sole basis for treatment or other patient management decisions. Negative results must be combined with clinical observations, patient history, and epidemiological information. The expected result is Negative. Fact Sheet for Patients: SugarRoll.be Fact Sheet for Healthcare Providers: https://www.woods-mathews.com/ This test is not yet approved or cleared by the [place] FDA and  has been authorized for detection and/or diagnosis of SARS-CoV-2 by FDA under an Emergency Use Authorization (EUA). This EUA will remain  in effect (meaning this  test can be used) for the duration of the COVID-19 declaration under Section 56 4(b)(1) of the Act, 21 U.S.C. section 360bbb-3(b)(1), unless the authorization is terminated or revoked sooner. Performed at [facility], [place] [address], [place], [place] [address]   Urine Culture     Status: None   Collection Time: 05/29/19  5:47 PM   Specimen: Urine, Random  Result Value Ref Range Status   Specimen Description   Final    URINE, RANDOM Performed at [facility], [address], [place], [place] [address]    Special Requests   Final    NONE Performed at [facility], [address], [place], [place] [address]    Culture   Final    NO GROWTH Performed at [facility], [place] [address] [place], [place] [address]    Report Status 05/31/2019 FINAL  Final  MRSA PCR Screening     Status: None   Collection Time: 05/30/19  3:00 AM   Specimen: Nasal Mucosa; Nasopharyngeal  Result Value Ref Range Status   MRSA by PCR NEGATIVE NEGATIVE Final    Comment:        The GeneXpert MRSA Assay (FDA approved for NASAL specimens only), is one component of a comprehensive MRSA colonization surveillance program. It is not intended to diagnose MRSA infection nor to guide or monitor treatment for MRSA infections. Performed at [facility], [address] [place], [place] [address]      Labs: BNP (last 3 results) No results for input(s): BNP in the last 8760 hours. Basic Metabolic Panel: Recent Labs  Lab 05/29/19 2230 05/30/19 0120 05/30/19 0602 05/30/19 0929 05/30/19 1335 05/30/19 1742 05/30/19 2126 05/31/19 0148 05/31/19 0429  NA 111*   < > 113*   < > 117* 118* 121* 123* 127*  K 3.6   < > 3.8   < > 4.3 4.1 4.3 3.9 4.0  CL 75*   < > 78*   < > 84* 87* 90* 92* 93*  CO2 25   < > 22   < > 22 21* 22 24 25   GLUCOSE 91   < > 82   < > 123* 158* 122* 94 87  BUN 8   < > 8   < > 9 16 15 15 13   CREATININE 0.83   < > 0.74   < > 0.93 1.17* 1.04* 0.89  1.06*  CALCIUM 10.4*   < > 9.3   < > 9.4 9.4 9.3 8.9 9.3  MG 1.5*  --  2.5*  --   --   --   --   --   --    < > = values in this interval not displayed.   Liver Function Tests: Recent Labs  Lab 05/29/19 1356  AST 36  ALT 22  ALKPHOS  69  BILITOT 0.9  PROT 8.2*  ALBUMIN 4.9   No results for input(s): LIPASE, AMYLASE in the last 168 hours. No results for input(s): AMMONIA in the last 168 hours. CBC: Recent Labs  Lab 05/29/19 1356  WBC 7.7  NEUTROABS 5.6  HGB 11.9*  HCT 32.2*  MCV 86.8  PLT 252   Cardiac Enzymes: No results for input(s): CKTOTAL, CKMB, CKMBINDEX, TROPONINI in the last 168 hours. BNP: Invalid input(s): POCBNP CBG: Recent Labs  Lab 05/29/19 1401 05/30/19 0224  GLUCAP 101* 96   D-Dimer No results for input(s): DDIMER in the last 72 hours. Hgb A1c Recent Labs    05/30/19 0602  HGBA1C 5.3   Lipid Profile No results for input(s): CHOL, HDL, LDLCALC, TRIG, CHOLHDL, LDLDIRECT in the last 72 hours. Thyroid function studies Recent Labs    05/29/19 1650  TSH 1.900   Anemia work up No results for input(s): VITAMINB12, FOLATE, FERRITIN, TIBC, IRON, RETICCTPCT in the last 72 hours. Urinalysis    Component Value Date/Time   COLORURINE STRAW (A) 05/29/2019 1747   APPEARANCEUR CLEAR (A) 05/29/2019 1747   APPEARANCEUR Clear 03/21/2014 1413   LABSPEC 1.004 (L) 05/29/2019 1747   LABSPEC 1.001 03/21/2014 1413   PHURINE 7.0 05/29/2019 1747   GLUCOSEU NEGATIVE 05/29/2019 1747   GLUCOSEU Negative 03/21/2014 1413   HGBUR SMALL (A) 05/29/2019 1747   BILIRUBINUR NEGATIVE 05/29/2019 1747   BILIRUBINUR Negative 03/21/2014 1413   KETONESUR NEGATIVE 05/29/2019 1747   PROTEINUR NEGATIVE 05/29/2019 1747   NITRITE NEGATIVE 05/29/2019 1747   LEUKOCYTESUR MODERATE (A) 05/29/2019 1747   LEUKOCYTESUR Negative 03/21/2014 1413   Sepsis Labs Invalid input(s): PROCALCITONIN,  WBC,  LACTICIDVEN Microbiology Recent Results (from the past 240 hour(s))  SARS  CORONAVIRUS 2 (TAT 6-24 HRS) Nasopharyngeal Nasopharyngeal Swab     Status: None   Collection Time: 05/29/19  4:50 PM   Specimen: Nasopharyngeal Swab  Result Value Ref Range Status   SARS Coronavirus 2 NEGATIVE NEGATIVE Final    Comment: (NOTE) SARS-CoV-2 target nucleic acids are NOT DETECTED. The SARS-CoV-2 RNA is generally detectable in upper and lower respiratory specimens during the acute phase of infection. Negative results do not preclude SARS-CoV-2 infection, do not rule out co-infections with other pathogens, and should not be used as the sole basis for treatment or other patient management decisions. Negative results must be combined with clinical observations, patient history, and epidemiological information. The expected result is Negative. Fact Sheet for Patients: SugarRoll.be Fact Sheet for Healthcare Providers: https://www.woods-mathews.com/ This test is not yet approved or cleared by the [place] FDA and  has been authorized for detection and/or diagnosis of SARS-CoV-2 by FDA under an Emergency Use Authorization (EUA). This EUA will remain  in effect (meaning this test can be used) for the duration of the COVID-19 declaration under Section 56 4(b)(1) of the Act, 21 U.S.C. section 360bbb-3(b)(1), unless the authorization is terminated or revoked sooner. Performed at [facility], [place] [address], [place], [place] [address]   Urine Culture     Status: None   Collection Time: 05/29/19  5:47 PM   Specimen: Urine, Random  Result Value Ref Range Status   Specimen Description   Final    URINE, RANDOM Performed at [facility], [address] [place], [place] [address]    Special Requests   Final    NONE Performed at [facility], [address] [place], [place] [address]    Culture   Final  NO GROWTH Performed at [facility], [place] [address] [place], [place] [address]    Report Status  05/31/2019 FINAL  Final  MRSA PCR Screening     Status: None   Collection Time: 05/30/19  3:00 AM   Specimen: Nasal Mucosa; Nasopharyngeal  Result Value Ref Range Status   MRSA by PCR NEGATIVE NEGATIVE Final    Comment:        The GeneXpert MRSA Assay (FDA approved for NASAL specimens only), is one component of a comprehensive MRSA colonization surveillance program. It is not intended to diagnose MRSA infection nor to guide or monitor treatment for MRSA infections. Performed at [facility], [address] [place],  [address]     Time coordinating discharge: Over 30 minutes  SIGNED:  Lorella Nimrod, MD  [facility] 05/31/2019, 9:21 AM  If 7PM-7AM, please contact night-coverage [email or web address]  This record has been created using Systems analyst. Errors have been sought and corrected,but may not always be located. Such creation errors do not reflect on the standard of care.

## 2019-05-31 NOTE — Progress Notes (Signed)
BP (!) 82/56   Pulse (!) 57   Temp 98.4 F (36.9 C) (Oral)   Resp 12   Ht 5\' 6"  (1.676 m)   Wt 66.1 kg   SpO2 97%   BMI 23.52 kg/m   SODIUM IMPROVING  Vital signs reviewed, ICU needs resolved  Will sign off at this time. No further recommendations at this time.  Please call [phone] for further questions. Thank you.    Corrin Parker, M.D.  [facility]  Medical Director [place] Director [facility] Cardio-Pulmonary Department

## 2019-05-31 NOTE — Progress Notes (Signed)
Pt given discharge instructions with understanding. Pt has no questions at this time. Awaiting for granddaughter to pick her up.

## 2019-06-07 ENCOUNTER — Ambulatory Visit: Payer: Medicare HMO | Attending: Internal Medicine

## 2019-06-07 DIAGNOSIS — Z23 Encounter for immunization: Secondary | ICD-10-CM

## 2019-06-07 NOTE — Progress Notes (Signed)
   Covid-19 Vaccination Clinic  Name:  Lessley Shute    MRN: [id number] DOB: 10/27/1952  06/07/2019  Ms. Cifelli was observed post Covid-19 immunization for 15 minutes without incident. She was provided with Vaccine Information Sheet and instruction to access the V-Safe system.   Ms. Vallis was instructed to call 911 with any severe reactions post vaccine: Marland Kitchen Difficulty breathing  . Swelling of face and throat  . A fast heartbeat  . A bad rash all over body  . Dizziness and weakness   Immunizations Administered    Name Date Dose VIS Date Route   Pfizer COVID-19 Vaccine 06/07/2019 12:47 PM 0.3 mL 02/15/2019 Intramuscular   Manufacturer: [place]   Lot: [id number]   [place]: [id number]

## 2019-09-20 ENCOUNTER — Other Ambulatory Visit: Payer: Self-pay | Admitting: Internal Medicine

## 2019-09-20 DIAGNOSIS — Z1231 Encounter for screening mammogram for malignant neoplasm of breast: Secondary | ICD-10-CM

## 2019-11-07 ENCOUNTER — Ambulatory Visit
Admission: RE | Admit: 2019-11-07 | Discharge: 2019-11-07 | Disposition: A | Discharge status: Home / Self Care | Payer: Medicare HMO | Source: Ambulatory Visit | Attending: Internal Medicine | Admitting: Internal Medicine

## 2019-11-07 DIAGNOSIS — Z1231 Encounter for screening mammogram for malignant neoplasm of breast: Secondary | ICD-10-CM | POA: Diagnosis present

## 2020-08-06 ENCOUNTER — Emergency Department
Admission: EM | Admit: 2020-08-06 | Discharge: 2020-08-06 | Discharge status: Against Medical Advice | Payer: Medicare HMO

## 2020-09-05 ENCOUNTER — Other Ambulatory Visit: Payer: Self-pay

## 2020-09-05 ENCOUNTER — Observation Stay
Admission: EM | Admit: 2020-09-05 | Discharge: 2020-09-06 | Disposition: A | Discharge status: Home / Self Care | Payer: Medicare HMO | Attending: Family Medicine | Admitting: Family Medicine

## 2020-09-05 ENCOUNTER — Emergency Department: Payer: Medicare HMO

## 2020-09-05 DIAGNOSIS — Z96651 Presence of right artificial knee joint: Secondary | ICD-10-CM | POA: Insufficient documentation

## 2020-09-05 DIAGNOSIS — I10 Essential (primary) hypertension: Secondary | ICD-10-CM | POA: Diagnosis not present

## 2020-09-05 DIAGNOSIS — Z20822 Contact with and (suspected) exposure to covid-19: Secondary | ICD-10-CM | POA: Diagnosis not present

## 2020-09-05 DIAGNOSIS — Z7982 Long term (current) use of aspirin: Secondary | ICD-10-CM | POA: Diagnosis not present

## 2020-09-05 DIAGNOSIS — Z79899 Other long term (current) drug therapy: Secondary | ICD-10-CM | POA: Insufficient documentation

## 2020-09-05 DIAGNOSIS — R55 Syncope and collapse: Secondary | ICD-10-CM | POA: Diagnosis not present

## 2020-09-05 DIAGNOSIS — F419 Anxiety disorder, unspecified: Secondary | ICD-10-CM | POA: Diagnosis not present

## 2020-09-05 DIAGNOSIS — R0989 Other specified symptoms and signs involving the circulatory and respiratory systems: Secondary | ICD-10-CM

## 2020-09-05 DIAGNOSIS — F17210 Nicotine dependence, cigarettes, uncomplicated: Secondary | ICD-10-CM | POA: Diagnosis not present

## 2020-09-05 DIAGNOSIS — I160 Hypertensive urgency: Principal | ICD-10-CM

## 2020-09-05 DIAGNOSIS — F32A Depression, unspecified: Secondary | ICD-10-CM

## 2020-09-05 DIAGNOSIS — R42 Dizziness and giddiness: Secondary | ICD-10-CM | POA: Diagnosis present

## 2020-09-05 HISTORY — DX: Tobacco use: Z72.0

## 2020-09-05 HISTORY — DX: Weakness: R53.1

## 2020-09-05 HISTORY — DX: Alcohol abuse, uncomplicated: F10.10

## 2020-09-05 HISTORY — DX: Other psychoactive substance abuse, uncomplicated: F19.10

## 2020-09-05 HISTORY — DX: Anxiety disorder, unspecified: F41.9

## 2020-09-05 HISTORY — DX: Depression, unspecified: F32.A

## 2020-09-05 HISTORY — DX: Disorder of arteries and arterioles, unspecified: I77.9

## 2020-09-05 HISTORY — DX: Chronic kidney disease, stage 3 unspecified: N18.30

## 2020-09-05 LAB — RESP PANEL BY RT-PCR (FLU A&B, COVID) ARPGX2
Influenza A by PCR: NEGATIVE
Influenza B by PCR: NEGATIVE
SARS Coronavirus 2 by RT PCR: NEGATIVE

## 2020-09-05 LAB — CBC
HCT: 41.1 % (ref 36.0–46.0)
Hemoglobin: 13.8 g/dL (ref 12.0–15.0)
MCH: 31.7 pg (ref 26.0–34.0)
MCHC: 33.6 g/dL (ref 30.0–36.0)
MCV: 94.5 fL (ref 80.0–100.0)
Platelets: 271 10*3/uL (ref 150–400)
RBC: 4.35 MIL/uL (ref 3.87–5.11)
RDW: 14.5 % (ref 11.5–15.5)
WBC: 7.4 10*3/uL (ref 4.0–10.5)
nRBC: 0 % (ref 0.0–0.2)

## 2020-09-05 LAB — BASIC METABOLIC PANEL
Anion gap: 8 (ref 5–15)
BUN: 16 mg/dL (ref 8–23)
CO2: 22 mmol/L (ref 22–32)
Calcium: 10.1 mg/dL (ref 8.9–10.3)
Chloride: 106 mmol/L (ref 98–111)
Creatinine, Ser: 1.1 mg/dL — ABNORMAL HIGH (ref 0.44–1.00)
GFR, Estimated: 56 mL/min — ABNORMAL LOW (ref >60)
Glucose, Bld: 96 mg/dL (ref 70–99)
Potassium: 3.7 mmol/L (ref 3.5–5.1)
Sodium: 136 mmol/L (ref 135–145)

## 2020-09-05 LAB — URINALYSIS, COMPLETE (UACMP) WITH MICROSCOPIC
Bacteria, UA: NONE SEEN
Bilirubin Urine: NEGATIVE
Glucose, UA: NEGATIVE mg/dL
Ketones, ur: NEGATIVE mg/dL
Leukocytes,Ua: NEGATIVE
Nitrite: NEGATIVE
Protein, ur: NEGATIVE mg/dL
Specific Gravity, Urine: 1.004 — ABNORMAL LOW (ref 1.005–1.030)
pH: 5 (ref 5.0–8.0)

## 2020-09-05 MED ORDER — ONDANSETRON HCL 4 MG/2ML IJ SOLN: 4.0000 mg | Freq: Four times a day (QID) | INTRAMUSCULAR | Status: DC | PRN

## 2020-09-05 MED ORDER — VITAMIN D 25 MCG (1000 UNIT) PO TABS
1000.0000 [IU] | ORAL_TABLET | ORAL | Status: DC
  Filled 2020-09-05: qty 1

## 2020-09-05 MED ORDER — ACETAMINOPHEN 325 MG PO TABS: 650.0000 mg | ORAL_TABLET | Freq: Four times a day (QID) | ORAL | Status: DC | PRN

## 2020-09-05 MED ORDER — MAGNESIUM HYDROXIDE 400 MG/5ML PO SUSP: 30.0000 mL | Freq: Every day | ORAL | Status: DC | PRN

## 2020-09-05 MED ORDER — CLONAZEPAM 0.5 MG PO TABS
0.5000 mg | ORAL_TABLET | Freq: Two times a day (BID) | ORAL | Status: DC | PRN
  Administered 2020-09-05 – 2020-09-06 (×2): 0.5 mg via ORAL
  Filled 2020-09-05 (×2): qty 1

## 2020-09-05 MED ORDER — SODIUM CHLORIDE 0.9 % IV SOLN: INTRAVENOUS | Status: DC

## 2020-09-05 MED ORDER — ONDANSETRON HCL 4 MG PO TABS: 4.0000 mg | ORAL_TABLET | Freq: Four times a day (QID) | ORAL | Status: DC | PRN

## 2020-09-05 MED ORDER — TRAZODONE HCL 50 MG PO TABS
25.0000 mg | ORAL_TABLET | Freq: Every evening | ORAL | Status: DC | PRN
  Administered 2020-09-06: 25 mg via ORAL
  Filled 2020-09-05: qty 1

## 2020-09-05 MED ORDER — HYDROXYZINE HCL 25 MG PO TABS: 25.0000 mg | ORAL_TABLET | ORAL | Status: DC | PRN

## 2020-09-05 MED ORDER — ATORVASTATIN CALCIUM 20 MG PO TABS
40.0000 mg | ORAL_TABLET | Freq: Every day | ORAL | Status: DC
  Filled 2020-09-05: qty 2

## 2020-09-05 MED ORDER — LOSARTAN POTASSIUM 50 MG PO TABS
100.0000 mg | ORAL_TABLET | Freq: Every day | ORAL | Status: DC
  Administered 2020-09-06: 100 mg via ORAL
  Filled 2020-09-05: qty 2

## 2020-09-05 MED ORDER — ACETAMINOPHEN 650 MG RE SUPP: 650.0000 mg | Freq: Four times a day (QID) | RECTAL | Status: DC | PRN

## 2020-09-05 MED ORDER — ENOXAPARIN SODIUM 40 MG/0.4ML IJ SOSY
40.0000 mg | PREFILLED_SYRINGE | INTRAMUSCULAR | Status: DC
  Administered 2020-09-05: 40 mg via SUBCUTANEOUS
  Filled 2020-09-05: qty 0.4

## 2020-09-05 MED ORDER — ASPIRIN EC 81 MG PO TBEC
81.0000 mg | DELAYED_RELEASE_TABLET | Freq: Every day | ORAL | Status: DC
  Administered 2020-09-06: 81 mg via ORAL
  Filled 2020-09-05: qty 1

## 2020-09-05 MED ORDER — AMLODIPINE BESYLATE 5 MG PO TABS
5.0000 mg | ORAL_TABLET | Freq: Once | ORAL | Status: AC
  Administered 2020-09-05: 5 mg via ORAL
  Filled 2020-09-05: qty 1

## 2020-09-05 NOTE — ED Triage Notes (Signed)
Pt to ED [facility] from home for dizziness that started yesterday morning. Denies other complaints. Alert and oriented, NAD noted.

## 2020-09-05 NOTE — ED Notes (Signed)
Pt assisted to bedside commode

## 2020-09-05 NOTE — ED Notes (Signed)
Per ED tech- pt went to go to bathroom and got dizzy and passed out in bed. Pt AOX4 qat this time. EDP notified.

## 2020-09-05 NOTE — ED Triage Notes (Signed)
Pt arrives via ems from home with c/o dizziness x 2 day, lightheaded, unsteady on feet. Denies n/v/d. Pt a&o x 4 per EMS. Pt reported to ems that she had a similar event last year when her sodium level was low.   Ems vitals Bp-175/99 HR- 85

## 2020-09-05 NOTE — ED Provider Notes (Signed)
[facility] Emergency Department Provider Note ____________________________________________   Event Date/Time   First MD Initiated Contact with Patient 09/05/20 1612     (approximate)  I have reviewed the triage vital signs and the nursing notes.  HISTORY  Chief Complaint Dizziness  HPI Erin Lewis is a 65 y.o. female history of hypertension hyperlipidemia  Patient reports that she is been feeling "dizzy" for 2 days.  She cannot describe the symptoms much more than that, but reports when she sits stands and all day for the last 2 days she is just had a strange feeling that is hard to describe.  Not numb not weak no trouble speaking no headache.  Does report something feels off and he just feels like she is having a hard time with feeling like she is not lightheaded.  It does not feel like a spinning sensation is not worsened by head movement  No chest pain or trouble breathing.  Compliant with her blood pressure medicine, takes 100 mg amlodipine daily last took this morning  She reports she felt similar when she had a low sodium level a year or 2 ago but is off the medicine that because that   Past Medical History:  Diagnosis Date   Hyperlipidemia    Hypertension     Patient Active Problem List   Diagnosis Date Noted   Hypertensive urgency 09/05/2020   Acute hyponatremia 05/29/2019   Dizziness    Alcohol use disorder, moderate, dependence ([place]) 05/20/2018   Recurrent major depression-severe ([place]) 05/20/2018   Rectal bleeding 07/11/2016   Sensory deficit, left 03/11/2016   HTN (hypertension) 03/11/2016   Expressive aphasia 03/11/2016   Weakness of left lower extremity 03/11/2016   Major depressive disorder, recurrent, severe without psychotic features ([place]) 10/07/2014   Anxiety reaction 10/07/2014    Past Surgical History:  Procedure Laterality Date   [facility]   CESAREAN SECTION     x2   COLONOSCOPY WITH  PROPOFOL N/A 07/20/2016   Procedure: COLONOSCOPY WITH PROPOFOL;  Surgeon: Robert Bellow, MD;  Location: [facility] ENDOSCOPY;  Service: Endoscopy;  Laterality: N/A;   laproscopy     abdomen   SHOULDER ARTHROSCOPY W/ ROTATOR CUFF REPAIR     x2   TOTAL SHOULDER ARTHROPLASTY Right 6-7 years ago    Prior to Admission medications   Medication Sig Start Date End Date Taking? Authorizing Provider  aspirin EC 81 MG tablet Take 81 mg by mouth daily.    [provider]  atorvastatin (LIPITOR) 40 MG tablet Take 40 mg by mouth daily.  09/05/18   [provider]  cholecalciferol (VITAMIN D3) 25 MCG (1000 UT) tablet Take 1 tablet (1,000 Units total) by mouth daily. Patient taking differently: Take 1,000 Units by mouth every 14 (fourteen) days.  05/23/18   Clapacs, Madie Reno, MD  clonazePAM (KLONOPIN) 0.5 MG tablet Take 0.5 mg by mouth 2 (two) times daily as needed for anxiety.  05/05/18   [provider]  hydrOXYzine (ATARAX/VISTARIL) 25 MG tablet Take 25 mg by mouth every 4 (four) hours as needed for sleep. 12/26/16   [provider]  losartan (COZAAR) 100 MG tablet Take 1 tablet (100 mg total) by mouth daily. 05/31/19   Lorella Nimrod, MD    Allergies Oxcarbazepine  Family History  Problem Relation Age of Onset   Hypertension Other    Diabetes Other    Colon polyps Father 39   Breast cancer Neg  Hx     Social History Social History   Tobacco Use   Smoking status: Every Day    Packs/day: 0.50    Pack years: 0.00    Types: Cigarettes   Smokeless tobacco: Never  Vaping Use   Vaping Use: Never used  Substance Use Topics   Alcohol use: Yes    Comment: occasionally   Drug use: No    Review of Systems Constitutional: No fever/chills Eyes: No visual changes.  No blurring of vision. ENT: No sore throat. Cardiovascular: Denies chest pain. Respiratory: Denies shortness of breath. Gastrointestinal: No abdominal pain.   Genitourinary: Negative for  dysuria. Musculoskeletal: Negative for back pain. Skin: Negative for rash. Neurological: Negative for headaches, areas of focal weakness or numbness.  However he reports a feeling of "dizzy" or a weird lightheaded feeling.    ____________________________________________   PHYSICAL EXAM:  VITAL SIGNS: ED Triage Vitals [09/05/20 1328]  Enc Vitals Group     BP (!) 169/101     Pulse Rate 65     Resp 18     Temp 99.2 F (37.3 C)     Temp Source Oral     SpO2 100 %     Weight 160 lb (72.6 kg)     Height 5\' 5"  (1.651 m)     Head Circumference      Peak Flow      Pain Score 0     Pain Loc      Pain Edu?      Excl. in [place]?     Constitutional: Alert and oriented. Well appearing and in no acute distress. Eyes: Conjunctivae are normal. Head: Atraumatic.  Moves head side to side turns head without worsening of dizzy feeling. Nose: No congestion/rhinnorhea. Mouth/Throat: Mucous membranes are moist. Neck: No stridor.  Full range of motion of neck without pain. Cardiovascular: Normal rate, regular rhythm. Grossly normal heart sounds.  Good peripheral circulation. Respiratory: Normal respiratory effort.  No retractions. Lungs CTAB. Gastrointestinal: Soft and nontender. No distention. Musculoskeletal: No lower extremity tenderness nor edema. Neurologic:  Normal speech and language. No gross focal neurologic deficits are appreciated.  NIH equals 0, performed by me. Skin:  Skin is warm, dry and intact. No rash noted. Psychiatric: Mood and affect are normal. Speech and behavior are normal.  Notably hypertensive.  With standing the patient also has severe hypertension. ____________________________________________   LABS (all labs ordered are listed, but only abnormal results are displayed)  Labs Reviewed  BASIC METABOLIC PANEL - Abnormal; Notable for the following components:      Result Value   Creatinine, Ser 1.10 (*)    GFR, Estimated 56 (*)    All other components within normal  limits  URINALYSIS, COMPLETE (UACMP) WITH MICROSCOPIC - Abnormal; Notable for the following components:   Color, Urine STRAW (*)    APPearance CLEAR (*)    Specific Gravity, Urine 1.004 (*)    Hgb urine dipstick SMALL (*)    All other components within normal limits  RESP PANEL BY RT-PCR (FLU A&B, COVID) ARPGX2  CBC  HIV ANTIBODY (ROUTINE TESTING W REFLEX)  BASIC METABOLIC PANEL  CBC   ____________________________________________  EKG  Reviewed interpreted at 1330 Heart rate 70 QRS 99 QTc 420 Normal sinus rhythm no evidence of ischemia or ectopy. ____________________________________________  RADIOLOGY  MR BRAIN WO CONTRAST  Result Date: 09/05/2020 CLINICAL DATA:  Vertigo EXAM: MRI HEAD WITHOUT CONTRAST TECHNIQUE: Multiplanar, multiecho pulse sequences of the brain and surrounding structures were  obtained without intravenous contrast. COMPARISON:  None. FINDINGS: Brain: There is no acute infarction or intracranial hemorrhage. There is no intracranial mass, mass effect, or edema. There is no hydrocephalus or extra-axial fluid collection. Ventricles and sulci are normal in size and configuration. Vascular: Major vessel flow voids at the skull base are preserved. Skull and upper cervical spine: Normal marrow signal is preserved. Sinuses/Orbits: Paranasal sinuses are aerated. Orbits are unremarkable. Other: Sella is partially empty.  Mastoid air cells are clear. IMPRESSION: No evidence of recent infarction, hemorrhage, or mass. Electronically Signed   By: Macy Mis M.D.   On: 09/05/2020 18:28      MRI brain negative for acute finding. ____________________________________________   PROCEDURES  Procedure(s) performed: None  Procedures  Critical Care performed: No  ____________________________________________   INITIAL IMPRESSION / ASSESSMENT AND PLAN / ED COURSE  Pertinent labs & imaging results that were available during my care of the patient were reviewed by me and  considered in my medical decision making (see chart for details).   Patient presents for lightheadedness.  Somewhat hard for her to describe.  Neurologically intact.  Normal NIH exam.  She has severe hypertension here, and after her work-up to this point at 5 PM her lab work appears quite normal, urinalysis normal, EKG normal.  Have ordered MRI to further evaluate for central neurologic process.  She shows no peripheral motor weaknesss, sensory loss, or ataxia.  Noncompliant with her blood pressure medicine, have also given additional secondary blood pressure medicine here.  Clinical Course as of 09/05/20 1950  Sat Sep 05, 2020  1628 Personally check patient standing orthostatic, when she stands blood pressure even higher 189/110.  She endorses feeling lightheaded or strange feeling hard to describe.  No chest pain no no trouble breathing.  No signs or symptoms to suggest acute CHF. [MQ]    Clinical Course User Index [MQ] Delman Kitten, MD   ----------------------------------------- 7:50 PM on 09/05/2020 -----------------------------------------  Patient is resting her blood pressure is improved.  However when she tries to get up she is able then developed near syncopal feeling, was unable to ambulate to the bathroom because she developed a near syncopal feeling with standing.  Or immediately thereafter she is noted to have a significant increase in her diastolic and systolic pressures.  She has notable systolic hypertension, but when resting in the bed her blood pressure does downtrend with improvement in symptoms seem to improve.  I am concerned about diastolic findings, and given her near syncopal feeling with ambulation I suspect the patient needs observation and further cardiac work-up such as perhaps echocardiogram.  Given her blood pressure is improved significantly with resting, plan not to give any IV antihypertensive at this point as she is relatively asymptomatic in bed, but when she goes to  ambulate then develops near syncopal symptoms and severe lightheadedness.  Somewhat unusual presentation, but will further evaluate and observe in the hospital for what appears to be hypertensive urgency and borderline hypertensive emergency given the symptoms that she develops when ambulating.  No evidence of stroke or posterior reversible encephalopathy noted at this time.  No chest pain no trouble breathing.  No edema.  No obvious findings of CHF  Admission discussed with Dr. Sidney Ace  Patient understanding agreeable with plan for admission  ____________________________________________   FINAL CLINICAL IMPRESSION(S) / ED DIAGNOSES  Final diagnoses:  Hypertensive urgency  Diastolic hypertension        Note:  This document was prepared using Dragon voice recognition  software and may include unintentional dictation errors       Delman Kitten, MD 09/05/20 1952

## 2020-09-05 NOTE — ED Notes (Signed)
Pt asssisted to bedside commode.

## 2020-09-05 NOTE — ED Notes (Signed)
Pt back from MRI at this time

## 2020-09-05 NOTE — H&P (Signed)
[place]   PATIENT NAME: Erin Lewis    MR#:  [id number]  DATE OF BIRTH:  Nov 19, 1955  DATE OF ADMISSION:  09/05/2020  PRIMARY CARE PHYSICIAN: Casilda Carls, MD   Patient is coming from: Home  REQUESTING/REFERRING PHYSICIAN: Delman Kitten, MD  CHIEF COMPLAINT:   Chief Complaint  Patient presents with  . Dizziness    HISTORY OF PRESENT ILLNESS:  Erin Lewis is a 65 y.o. African-American female with medical history significant for hypertension dyslipidemia, anxiety and depression, who presents to the emergency room with acute onset of dizziness and feeling wobbly for the last couple of days with unsteady gait.  She got up in the emergency room and felt she was going to pass out.  She denies any vertigo or tinnitus.  No paresthesias or focal muscle weakness.  No headache or blurred vision or diplopia.  No chest pain or palpitations.  No cough or wheezing or hemoptysis.  No nausea or vomiting or abdominal pain.   No bleeding diathesis.  She used to be on hydrochlorothiazide and stopped it due to hyponatremia.  She was having bilateral earache more on the left than the right.  She admits to urinary frequency without urgency or dysuria or hematuria or flank pain.  ED Course: Upon presentation to the emergency room blood pressure was 169/101 and later 189/110 with normal respiratory rate and later 26.  Labs revealed unremarkable BMP and CBC.  UA was negative.  Respiratory panel is currently pending.  EKG as reviewed by me : Showed normal sinus rhythm with a rate of 71. Imaging: Brain MRI without contrast revealed no evidence for recent infarction, hemorrhage or mass.  The patient was given 5 mg of p.o. Norvasc.  She will be admitted to an observation medically monitored bed for further evaluation and management. PAST MEDICAL HISTORY:   Past Medical History:  Diagnosis Date  . Hyperlipidemia   . Hypertension   Anxiety Major depression with psychotic features  PAST  SURGICAL HISTORY:   Past Surgical History:  Procedure Laterality Date  . [facility]BD[facility]  . CESAREAN SECTION     x2  . COLONOSCOPY WITH PROPOFOL N/A 07/20/2016   Procedure: COLONOSCOPY WITH PROPOFOL;  Surgeon: Robert Bellow, MD;  Location: [facility] ENDOSCOPY;  Service: Endoscopy;  Laterality: N/A;  . laproscopy     abdomen  . SHOULDER ARTHROSCOPY W/ ROTATOR CUFF REPAIR     x2  . TOTAL SHOULDER ARTHROPLASTY Right 6-7 years ago    SOCIAL HISTORY:   Social History   Tobacco Use  . Smoking status: Every Day    Packs/day: 0.50    Pack years: 0.00    Types: Cigarettes  . Smokeless tobacco: Never  Substance Use Topics  . Alcohol use: Yes    Comment: occasionally    FAMILY HISTORY:   Family History  Problem Relation Age of Onset  . Hypertension Other   . Diabetes Other   . Colon polyps Father 86  . Breast cancer Neg Hx     DRUG ALLERGIES:   Allergies  Allergen Reactions  . Oxcarbazepine Other (See Comments)    Hyponatremia    REVIEW OF SYSTEMS:   ROS As per history of present illness. All pertinent systems were reviewed above. Constitutional, HEENT, cardiovascular, respiratory, GI, GU, musculoskeletal, neuro, psychiatric, endocrine, integumentary and hematologic systems were reviewed and are otherwise negative/unremarkable except for positive findings mentioned above in the HPI.   MEDICATIONS AT  HOME:   Prior to Admission medications   Medication Sig Start Date End Date Taking? Authorizing Provider  aspirin EC 81 MG tablet Take 81 mg by mouth daily.    [provider]  atorvastatin (LIPITOR) 40 MG tablet Take 40 mg by mouth daily.  09/05/18   [provider]  cholecalciferol (VITAMIN D3) 25 MCG (1000 UT) tablet Take 1 tablet (1,000 Units total) by mouth daily. Patient taking differently: Take 1,000 Units by mouth every 14 (fourteen) days.  05/23/18   Clapacs, Madie Reno, MD  clonazePAM (KLONOPIN) 0.5 MG tablet Take 0.5 mg by  mouth 2 (two) times daily as needed for anxiety.  05/05/18   [provider]  hydrOXYzine (ATARAX/VISTARIL) 25 MG tablet Take 25 mg by mouth every 4 (four) hours as needed for sleep. 12/26/16   [provider]  losartan (COZAAR) 100 MG tablet Take 1 tablet (100 mg total) by mouth daily. 05/31/19   Lorella Nimrod, MD      VITAL SIGNS:  Blood pressure (!) 184/90, pulse 68, temperature 99.2 F (37.3 C), temperature source Oral, resp. rate (!) 27, height 5\' 5"  (1.651 m), weight 72.6 kg, SpO2 98 %.  PHYSICAL EXAMINATION:  Physical Exam  GENERAL:  65 y.o.-year-old female patient lying in the bed with no acute distress.  EYES: Pupils equal, round, reactive to light and accommodation. No scleral icterus. Extraocular muscles intact.  HEENT: Head atraumatic, normocephalic. Oropharynx and nasopharynx clear.  Ears showed clear TMs and mildly ceruminous EACs bilaterally. NECK:  Supple, no jugular venous distention. No thyroid enlargement, no tenderness.  LUNGS: Normal breath sounds bilaterally, no wheezing, rales,rhonchi or crepitation. No use of accessory muscles of respiration.  CARDIOVASCULAR: Regular rate and rhythm, S1, S2 normal. No murmurs, rubs, or gallops.  ABDOMEN: Soft, nondistended, nontender. Bowel sounds present. No organomegaly or mass.  EXTREMITIES: No pedal edema, cyanosis, or clubbing.  NEUROLOGIC: Cranial nerves II through XII are intact. Muscle strength 5/5 in all extremities. Sensation intact. Gait not checked.  PSYCHIATRIC: The patient is alert and oriented x 3.  Normal affect and good eye contact. SKIN: No obvious rash, lesion, or ulcer.   LABORATORY PANEL:   CBC Recent Labs  Lab 09/05/20 1331  WBC 7.4  HGB 13.8  HCT 41.1  PLT 271   ------------------------------------------------------------------------------------------------------------------  Chemistries  Recent Labs  Lab 09/05/20 1331  NA 136  K 3.7  CL 106  CO2 22  GLUCOSE 96  BUN 16   CREATININE 1.10*  CALCIUM 10.1   ------------------------------------------------------------------------------------------------------------------  Cardiac Enzymes No results for input(s): TROPONINI in the last 168 hours. ------------------------------------------------------------------------------------------------------------------  RADIOLOGY:  MR BRAIN WO CONTRAST  Result Date: 09/05/2020 CLINICAL DATA:  Vertigo EXAM: MRI HEAD WITHOUT CONTRAST TECHNIQUE: Multiplanar, multiecho pulse sequences of the brain and surrounding structures were obtained without intravenous contrast. COMPARISON:  None. FINDINGS: Brain: There is no acute infarction or intracranial hemorrhage. There is no intracranial mass, mass effect, or edema. There is no hydrocephalus or extra-axial fluid collection. Ventricles and sulci are normal in size and configuration. Vascular: Major vessel flow voids at the skull base are preserved. Skull and upper cervical spine: Normal marrow signal is preserved. Sinuses/Orbits: Paranasal sinuses are aerated. Orbits are unremarkable. Other: Sella is partially empty.  Mastoid air cells are clear. IMPRESSION: No evidence of recent infarction, hemorrhage, or mass. Electronically Signed   By: Macy Mis M.D.   On: 09/05/2020 18:28      IMPRESSION AND PLAN:  Active Problems:   Hypertensive urgency  1.  Hypertensive urgency. - The patient was admitted to an observation medical monitored bed. - We will place her on as needed IV labetalol. - We will continue her Cozaar. - We will add 5 mg of p.o. Norvasc.  2.  Near syncope and unsteady gait. - We will check orthostatics and monitor for arrhythmias. - 2D echo be obtained. - Cardiology consult will be obtained. - I notified Dr. Rayann Heman about the patient. - We will follow neurochecks every 4 hours for 24 hours. - The patient had a negative brain MRI.  3.  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 - We will continue her  Klonopin, Prozac, aripiprazole and Seroquel.  4.  Dyslipidemia. - We will continue statin therapy.  DVT prophylaxis: Lovenox. Code Status: full code. Family Communication:  The plan of care was discussed in details with the patient (and family). I answered all questions. The patient agreed to proceed with the above mentioned plan. Further management will depend upon hospital course. Disposition Plan: Back to previous home environment Consults called: Cardiology. All the records are reviewed and case discussed with ED provider.  Status is: Observation  The patient remains OBS appropriate and will d/c before 2 midnights.  Dispo: The patient is from: Home              Anticipated d/c is to: Home              Patient currently is not medically stable to d/c.   Difficult to place patient No   TOTAL TIME TAKING CARE OF THIS PATIENT: 55 minutes.    Christel Mormon M.D on 09/05/2020 at 7:50 PM  [facility]   From 7 PM-7 AM, contact night-coverage [email or web address]  CC: Primary care physician; Casilda Carls, MD

## 2020-09-06 ENCOUNTER — Encounter: Payer: Self-pay | Admitting: Family Medicine

## 2020-09-06 ENCOUNTER — Observation Stay (HOSPITAL_BASED_OUTPATIENT_CLINIC_OR_DEPARTMENT_OTHER)
Admit: 2020-09-06 | Discharge: 2020-09-06 | Disposition: A | Discharge status: Home / Self Care | Payer: Medicare HMO | Attending: Family Medicine | Admitting: Family Medicine

## 2020-09-06 ENCOUNTER — Observation Stay: Payer: Medicare HMO

## 2020-09-06 DIAGNOSIS — R42 Dizziness and giddiness: Secondary | ICD-10-CM | POA: Diagnosis not present

## 2020-09-06 DIAGNOSIS — R55 Syncope and collapse: Secondary | ICD-10-CM

## 2020-09-06 DIAGNOSIS — I160 Hypertensive urgency: Secondary | ICD-10-CM | POA: Diagnosis not present

## 2020-09-06 LAB — ECHOCARDIOGRAM COMPLETE
AR max vel: 1.13 cm2
AV Area VTI: 1.36 cm2
AV Area mean vel: 1.43 cm2
AV Mean grad: 6.5 mmHg
AV Peak grad: 16.1 mmHg
Ao pk vel: 2.01 m/s
Area-P 1/2: 2.62 cm2
Height: 65 in
Weight: 2560 oz

## 2020-09-06 LAB — BASIC METABOLIC PANEL
Anion gap: 9 (ref 5–15)
BUN: 13 mg/dL (ref 8–23)
CO2: 25 mmol/L (ref 22–32)
Calcium: 10.1 mg/dL (ref 8.9–10.3)
Chloride: 106 mmol/L (ref 98–111)
Creatinine, Ser: 1.13 mg/dL — ABNORMAL HIGH (ref 0.44–1.00)
GFR, Estimated: 54 mL/min — ABNORMAL LOW (ref >60)
Glucose, Bld: 91 mg/dL (ref 70–99)
Potassium: 4.1 mmol/L (ref 3.5–5.1)
Sodium: 140 mmol/L (ref 135–145)

## 2020-09-06 LAB — CBC
HCT: 40.3 % (ref 36.0–46.0)
Hemoglobin: 13.4 g/dL (ref 12.0–15.0)
MCH: 31.2 pg (ref 26.0–34.0)
MCHC: 33.3 g/dL (ref 30.0–36.0)
MCV: 93.7 fL (ref 80.0–100.0)
Platelets: 267 10*3/uL (ref 150–400)
RBC: 4.3 MIL/uL (ref 3.87–5.11)
RDW: 14.6 % (ref 11.5–15.5)
WBC: 6.7 10*3/uL (ref 4.0–10.5)
nRBC: 0 % (ref 0.0–0.2)

## 2020-09-06 LAB — TSH: TSH: 2.321 u[IU]/mL (ref 0.350–4.500)

## 2020-09-06 LAB — HIV ANTIBODY (ROUTINE TESTING W REFLEX): HIV Screen 4th Generation wRfx: NONREACTIVE

## 2020-09-06 MED ORDER — AMLODIPINE BESYLATE 5 MG PO TABS: 5.0000 mg | ORAL_TABLET | Freq: Every day | ORAL | 0 refills | Status: AC

## 2020-09-06 MED ORDER — AMLODIPINE BESYLATE 5 MG PO TABS
5.0000 mg | ORAL_TABLET | Freq: Every day | ORAL | Status: DC
  Administered 2020-09-06: 5 mg via ORAL

## 2020-09-06 NOTE — Consult Note (Addendum)
Cardiology Consult    Patient ID: Erin Lewis MRN: [id number], DOB/AGE: 1955-11-25   Admit date: 09/05/2020 Date of Consult: 09/06/2020  Primary Physician: Casilda Carls, MD Primary Cardiologist: None - new Requesting Provider: Argie Ramming, MD  Patient Profile    Erin Lewis is a 65 y.o. female with a history of HTN, HL, anxiety, depression, CKD IIIa, and polysubstance abuse (tob/marijuana/etoh), who is being seen today for the evaluation of dizziness and hypertension at the request of Dr. Sidney Ace.  Past Medical History   Past Medical History:  Diagnosis Date   Alcohol abuse    Anxiety    Carotid arterial disease ([place])    a. 09/2013 U/S: <50% bilat ICA stenosis.   CKD (chronic kidney disease), stage III ([place])    Depression    Hyperlipidemia    Hypertension    Polysubstance abuse ([facility])    Tobacco abuse    Weakness    a. 12/2012 admit w/ L sided wkns-->No CVA; b. 12/2012 Echo: EF 60-65%, nl RV fxn.    Past Surgical History:  Procedure Laterality Date   [facility]   CESAREAN SECTION     x2   COLONOSCOPY WITH PROPOFOL N/A 07/20/2016   Procedure: COLONOSCOPY WITH PROPOFOL;  Surgeon: Robert Bellow, MD;  Location: [facility] ENDOSCOPY;  Service: Endoscopy;  Laterality: N/A;   laproscopy     abdomen   SHOULDER ARTHROSCOPY W/ ROTATOR CUFF REPAIR     x2   TOTAL SHOULDER ARTHROPLASTY Right 6-7 years ago     Allergies  Allergies  Allergen Reactions   Oxcarbazepine Other (See Comments)    Hyponatremia   Pork-Derived Products     History of Present Illness    65 y/o ? w/ the above PMH including HTN, HL, anxiety, depression, CKDIIIa, tob abuse, marijuana abuse, and alcohol abuse.  She denies any prior cardiac history and reports having a normal "screening" stress test performed by her PCP in January 2022.  She lives locally by herself.  She usually walks on her treadmill for 30-40 mins, 4x/wk.  She rarely uses alcohol, still smokes about 4  cigarettes/day, and cont to smoke marijuana occasionally, but can't objectify just how much.  She denies any prior h/o chest pain or dyspnea.  She was in her usoh until the afternoon of Thursday 6/30, when she was at home and noted lightheadedness and unsteady gait.  She says that she felt 'sorta drunk.'  Lightheadedness persisted regardless of positioning throughout the afternoon/evening on 6/30 and all day/evening 7/1.  When she awoke on 7/2, she felt well, but upon getting dressed and ready to visit her 67 y/o mother at her "rest home," she noted recurrently lightheadedness.  She tried walking down the hallway in her home but noted significant unsteadiness.  Her son assisted her to the couch where her LH persisted while lying down.  EMS was called and pt was found to be hypertensive.  She was transported to the [facility] ED where BP was initially 169/101 and subsequently 189/110.  ECG w/o acute ST/T changes.  CBC, BMET, TSH unremarkable.  MRI brain neg for stroke.  She says that [facility] Ss resolved following arrival to the ED.  She was given amlodipine 5mg  x 1 and BPs have been trending mostly in the 1-teens to 140's over the past 12 hrs.  She has no complaints at this time.  Inpatient Medications     aspirin EC  81 mg Oral Daily  atorvastatin  40 mg Oral Daily   cholecalciferol  1,000 Units Oral Q14 Days   enoxaparin (LOVENOX) injection  40 mg Subcutaneous Q24H   losartan  100 mg Oral Daily    Family History    Family History  Problem Relation Age of Onset   Diabetes Mother    Kidney disease Father    Diabetes Father    Heart attack Father    Colon polyps Father 41   Hypertension Other    Diabetes Other    Breast cancer Neg Hx    She indicated that her mother is alive. She indicated that her father is deceased. She indicated that the status of her neg hx is unknown. She indicated that the status of her other is unknown.   Social History    Social History   Socioeconomic History   Marital  status: Married    Spouse name: Not on file   Number of children: Not on file   Years of education: Not on file   Highest education level: Not on file  Occupational History   Not on file  Tobacco Use   Smoking status: Every Day    Packs/day: 0.50    Years: 45.00    Pack years: 22.50    Types: Cigarettes   Smokeless tobacco: Never   Tobacco comments:    Currently smoking about 4 cigarettes/day but smoked more previously.  Vaping Use   Vaping Use: Never used  Substance and Sexual Activity   Alcohol use: Yes    Comment: Occasionally now; previously drank heavily.   Drug use: Yes    Types: Marijuana    Comment: Occasionaly smokes pot.   Sexual activity: Not on file  Other Topics Concern   Not on file  Social History Narrative   Lives in [place] by herself.  Walks on her treadmill for 30-40 mins, 4x/wk.   Social Determinants of Health   Financial Resource Strain: Not on file  Food Insecurity: Not on file  Transportation Needs: Not on file  Physical Activity: Not on file  Stress: Not on file  Social Connections: Not on file  Intimate Partner Violence: Not on file     Review of Systems    General:  +++ lightheadedness x 3 days - since resolved.  No chills, fever, night sweats or weight changes.  Cardiovascular:  No chest pain, dyspnea on exertion, edema, orthopnea, palpitations, paroxysmal nocturnal dyspnea. Dermatological: No rash, lesions/masses Respiratory: No cough, dyspnea Urologic: No hematuria, dysuria Abdominal:   No nausea, vomiting, diarrhea, bright red blood per rectum, melena, or hematemesis Neurologic:  No visual changes, wkns, changes in mental status. All other systems reviewed and are otherwise negative except as noted above.  Physical Exam    Blood pressure 124/74, pulse (!) 59, temperature 98.1 F (36.7 C), temperature source Oral, resp. rate 16, height 5\' 5"  (1.651 m), weight 72.6 kg, SpO2 100 %.  General: Pleasant, NAD Psych: Normal  affect. Neuro: Alert and oriented X 3. Moves all extremities spontaneously. HEENT: Normal  Neck: Supple without JVD.  L carotid bruit noted. Lungs:  Resp regular and unlabored, CTA. Heart: RRR no s3, s4, or murmurs. Abdomen: Soft, non-tender, non-distended, BS + x 4.  Extremities: No clubbing, cyanosis or edema. DP/PT1+, Radials 2+ and equal bilaterally.  Labs      Lab Results  Component Value Date   WBC 6.7 09/06/2020   HGB 13.4 09/06/2020   HCT 40.3 09/06/2020   MCV 93.7 09/06/2020   PLT  267 09/06/2020    Recent Labs  Lab 09/06/20 0628  NA 140  K 4.1  CL 106  CO2 25  BUN 13  CREATININE 1.13*  CALCIUM 10.1  GLUCOSE 91   Lab Results  Component Value Date   CHOL 199 05/21/2018   HDL 84 05/21/2018   LDLCALC 73 05/21/2018   TRIG 212 (H) 05/21/2018    Radiology Studies    MR BRAIN WO CONTRAST  Result Date: 09/05/2020 CLINICAL DATA:  Vertigo EXAM: MRI HEAD WITHOUT CONTRAST TECHNIQUE: Multiplanar, multiecho pulse sequences of the brain and surrounding structures were obtained without intravenous contrast. COMPARISON:  None. FINDINGS: Brain: There is no acute infarction or intracranial hemorrhage. There is no intracranial mass, mass effect, or edema. There is no hydrocephalus or extra-axial fluid collection. Ventricles and sulci are normal in size and configuration. Vascular: Major vessel flow voids at the skull base are preserved. Skull and upper cervical spine: Normal marrow signal is preserved. Sinuses/Orbits: Paranasal sinuses are aerated. Orbits are unremarkable. Other: Sella is partially empty.  Mastoid air cells are clear. IMPRESSION: No evidence of recent infarction, hemorrhage, or mass. Electronically Signed   By: Macy Mis M.D.   On: 09/05/2020 18:28    ECG & Cardiac Imaging    RSR, 71, no acute ST/T changes - personally reviewed.  Assessment & Plan    1.  Lightheadedness/unsteady gait:  Pt developed lightheadedness and unsteady gait beginning on 6/30. This  was constant throughout the afternoon/evening on 6/30, and all day 7/1.  Ss were not present upon awakening 7/2, however recurred while getting dressed, prompting EMS and ED eval.  She was hypertensive on arrival.  Ss have since resolved and BPs improved w/ resumption of home dose of losartan.  Labs, ECG, brain MRI unremarkable.  No events on tele to account for symptoms (despite being symptomatic on arrival).  Currently asymptomatic and hemodynamically stable.  L carotid bruit noted on exam.  Prev carotid u/s w/ <50% bilat stenosis in 2015.  Repeat carotid u/s pending.  Echo also pending.  Orthostatic VS are pending, though based on constant Ss regardless of positioning and currently recorded VS (pressures higher when sitting) it's unlikely that orthostasis is playing a role.  Given substance abuse history, consider tox screen.  2.  Essential HTN:  Hypertensive on arrival.  Reports compliance w/ home meds and says bp was nl when she saw PCP recently.  BP better w/ resumption of home dose of losartan this AM.  3.  HL:  on statin @ home.  4.  Anxiety: uses klonopin prn @ home.  5.  CKDIII:  creat stable @ 1.13.  Cont ARB.  6.  Tob abuse/Marijuana abuse:  cessation advised.  Signed, Murray Hodgkins, NP 09/06/2020, 11:05 AM  For questions or updates, please contact   Please consult [email or web address] for contact info under Cardiology/STEMI.    I have seen, examined the patient, and reviewed the above assessment and plan.  Changes to above are made where necessary.  On exam, RRR.  BP is much improved.  No arrhythmias.  My suspicion for cardiac issue is low.  She reports "I think it was my anxiety".  BP is now better and symptoms are resolved.  If echo is low risk, no further CV workup is planned.  Cardiology team to see as needed while here. Please call with questions.   Co Sign: Thompson Grayer, MD 09/06/2020 3:02 PM

## 2020-09-06 NOTE — Evaluation (Signed)
Occupational Therapy Evaluation Patient Details Name: Erin Lewis MRN: [id number] DOB: 1956/03/05 Today's Date: 09/06/2020    History of Present Illness Pt is a 65 y/o F admitted on 09/05/20 with c/c of dizziness. Pt currently being treated for hypertensive urgency & near syncope. PMH: HTN, dyslipidemia, anxiety, depression   Clinical Impression   Erin Lewis reports today that she is at or near her baseline physical performance. She is able to ambulate in the room w/o DME, use toilet, wash, dress, INDly or with Mod IND (slightly increased time). BP 110/70. Pt does report, however, that she has been feeling very anxious and "fearful" lately, states that she hasn't been getting out of her house very often and has become "nervous" about driving. In the past she has enjoyed exercise (esp walking outdoors), but reports that she has not been doing so lately. She comments that she suspects the dizziness and high BP that brought her into the ED are stress-related symptoms. Provided therapeutic listening. Pt states she has a mental health therapist that she sees on occasion and with whom she has a good rapport. Encouraged pt to schedule an appt with this therapist ASAP. No further OT services needed at this time; pt in agreement.    Follow Up Recommendations  No OT follow up    Equipment Recommendations  None recommended by OT    Recommendations for Other Services Other (comment) (follow up with pt's established mental health therapist)     Precautions / Restrictions Precautions Precautions: None Restrictions Weight Bearing Restrictions: No      Mobility Bed Mobility Overal bed mobility: Modified Independent             General bed mobility comments: slightly increased time    Transfers Overall transfer level: Independent                    Balance Overall balance assessment: Modified Independent                                         ADL either  performed or assessed with clinical judgement   ADL Overall ADL's : Independent                                       General ADL Comments: IND in toileting, dressing, bed mobility, transfers, grooming     Vision Patient Visual Report: No change from baseline       Perception     Praxis      Pertinent Vitals/Pain Pain Assessment: No/denies pain     Hand Dominance     Extremity/Trunk Assessment Upper Extremity Assessment Upper Extremity Assessment: Overall WFL for tasks assessed   Lower Extremity Assessment Lower Extremity Assessment: Overall WFL for tasks assessed   Cervical / Trunk Assessment Cervical / Trunk Assessment: Normal   Communication Communication Communication: No difficulties   Cognition Arousal/Alertness: Awake/alert Behavior During Therapy: WFL for tasks assessed/performed Overall Cognitive Status: Within Functional Limits for tasks assessed                                 General Comments: A&O, but displays high level of anxiety   General Comments       Exercises Other Exercises Other Exercises:  Educ re: role of OT, stress managment, importance of occupational engagement, exercise/movement   Shoulder Instructions      Home Living Family/patient expects to be discharged to:: Private residence Living Arrangements: Other relatives (lives with granddaughter and grandson) Available Help at Discharge: Family Type of Home: House Home Access: Stairs to enter Technical brewer of Steps: 4 Entrance Stairs-Rails: Right Home Layout: One level               Home Equipment: Walker - 2 wheels          Prior Functioning/Environment Level of Independence: Independent        Comments: No DME, no falls. IND in ADL/IADL        OT Problem List: Decreased strength;Decreased activity tolerance;Cardiopulmonary status limiting activity      OT Treatment/Interventions:      OT Goals(Current goals can be  found in the care plan section) Acute Rehab OT Goals Patient Stated Goal: to get home OT Goal Formulation: With patient Time For Goal Achievement: 09/20/20 Potential to Achieve Goals: Good  OT Frequency:     Barriers to D/C:            Co-evaluation              AM-PAC OT "6 Clicks" Daily Activity     Outcome Measure Help from another person eating meals?: None Help from another person taking care of personal grooming?: None Help from another person toileting, which includes using toliet, bedpan, or urinal?: None Help from another person bathing (including washing, rinsing, drying)?: None Help from another person to put on and taking off regular upper body clothing?: None Help from another person to put on and taking off regular lower body clothing?: None 6 Click Score: 24   End of Session    Activity Tolerance: Patient tolerated treatment well Patient left: in bed;with call bell/phone within reach  OT Visit Diagnosis: Muscle weakness (generalized) (M62.81);Dizziness and giddiness (R42)                Time: 8841-6606 OT Time Calculation (min): 8 min Charges:  OT General Charges $OT Visit: 1 Visit OT Evaluation $OT Eval Low Complexity: 1 Low OT Treatments $Self Care/Home Management : 8-22 mins Josiah Lobo, PhD, MS, OTR/L 09/06/20, 3:01 PM

## 2020-09-06 NOTE — Evaluation (Addendum)
Physical Therapy Evaluation Patient Details Name: Erin Lewis MRN: [id number] DOB: 02/22/1956 Today's Date: 09/06/2020   History of Present Illness  Pt is a 65 y/o F admitted on 09/05/20 with c/c of dizziness. Pt currently being treated for hypertensive urgency & near syncope. PMH: HTN, dyslipidemia, anxiety, depression  Clinical Impression  Pt seen for PT evaluation with pt able to complete bed mobility mod I, & transfers & gait without AD & independence. Pt without overt LOB noted during session. Pt reports she has family that is home with her 23 hours/day. Pt voices no concerns re: mobility & d/c home. No acute PT needs identified at this time, will sign off. Please re-consult if new needs arise.   BP checked in LUE: Supine: 132/80 mmHg (MAP 103) Sitting: 136/105 mmHg (MAP 116) Standing at 0: 162/101 mmHg (MAP 121) Sitting EOB after gait: 175/92 mmHg (MAP 117) Pt denies adverse symptoms during session. Notified nurse & MD of BP readings.     Follow Up Recommendations No PT follow up    Equipment Recommendations  None recommended by PT    Recommendations for Other Services       Precautions / Restrictions Precautions Precautions: None Restrictions Weight Bearing Restrictions: No      Mobility  Bed Mobility Overal bed mobility: Modified Independent                  Transfers Overall transfer level: Independent                  Ambulation/Gait Ambulation/Gait assistance: Independent Gait Distance (Feet): 240 Feet Assistive device: None Gait Pattern/deviations: WFL(Within Functional Limits)        Stairs            Wheelchair Mobility    Modified Rankin (Stroke Patients Only)       Balance Overall balance assessment: Mild deficits observed, not formally tested                                           Pertinent Vitals/Pain Pain Assessment: No/denies pain    Home Living Family/patient expects to be discharged  to:: Private residence Living Arrangements:  (grandchildren (25 & 44 y/o)) Available Help at Discharge: Family (can provide assistance 23 hrs/day, 1 hour (2-3pm) when family's schedule overlaps) Type of Home: House Home Access: Stairs to enter Entrance Stairs-Rails: Right Entrance Stairs-Number of Steps: 4 Home Layout: One level Home Equipment: Walker - 2 wheels      Prior Function Level of Independence: Independent         Comments: Independent without AD, no falls in past 6 months. Pt reports she was receiving PT services & using RW 6 months ago 2/2 BLE weakness but this improved with therapy.     Hand Dominance        Extremity/Trunk Assessment   Upper Extremity Assessment Upper Extremity Assessment: Overall WFL for tasks assessed    Lower Extremity Assessment Lower Extremity Assessment: Overall WFL for tasks assessed       Communication   Communication: No difficulties  Cognition Arousal/Alertness: Awake/alert Behavior During Therapy: WFL for tasks assessed/performed Overall Cognitive Status: Within Functional Limits for tasks assessed  General Comments      Exercises     Assessment/Plan    PT Assessment Patent does not need any further PT services  PT Problem List         PT Treatment Interventions      PT Goals (Current goals can be found in the Care Plan section)  Acute Rehab PT Goals Patient Stated Goal: BP improve PT Goal Formulation: With patient Time For Goal Achievement: 09/20/20 Potential to Achieve Goals: Good    Frequency     Barriers to discharge        Co-evaluation               AM-PAC PT "6 Clicks" Mobility  Outcome Measure Help needed turning from your back to your side while in a flat bed without using bedrails?: None Help needed moving from lying on your back to sitting on the side of a flat bed without using bedrails?: None Help needed moving to and from a  bed to a chair (including a wheelchair)?: None Help needed standing up from a chair using your arms (e.g., wheelchair or bedside chair)?: None Help needed to walk in hospital room?: None Help needed climbing 3-5 steps with a railing? : None 6 Click Score: 24    End of Session   Activity Tolerance: Patient tolerated treatment well Patient left: in bed;with call bell/phone within reach        Time: 1122-1135 PT Time Calculation (min) (ACUTE ONLY): 13 min   Charges:   PT Evaluation $PT Eval Low Complexity: 1 Low          Lavone Nian, PT, DPT 09/06/20, 11:56 AM   Waunita Schooner 09/06/2020, 11:52 AM

## 2020-09-06 NOTE — Discharge Summary (Addendum)
Physician Discharge Summary  [name] [place] ZOX:[id number] DOB: 19-Jun-1955 DOA: 09/05/2020  PCP: Casilda Carls, MD  Admit date: 09/05/2020 Discharge date: 09/06/2020  Time spent: 40 minutes  Recommendations for Outpatient Follow-up:  Follow outpatient CBC/CMP Follow echo results, pending at time of discharge  Follow anxiety/mood outpatient - encouraged follow up with psych Follow BP on new agent, follow outpatient with PCP    Discharge Diagnoses:  Active Problems:   Hypertensive urgency   Discharge Condition: stable  Diet recommendation: heart healthy  Filed Weights   09/05/20 1328  Weight: 72.6 kg    History of present illness:  Erin Lewis is Erin Lewis 65 y.o. African-American female with medical history significant for hypertension dyslipidemia, anxiety and depression, who presents to the emergency room with acute onset of dizziness and feeling wobbly for the last couple of days with unsteady gait.  She got up in the emergency room and felt she was going to pass out.  She denies any vertigo or tinnitus.  No paresthesias or focal muscle weakness.  No headache or blurred vision or diplopia.  No chest pain or palpitations.  No cough or wheezing or hemoptysis.  No nausea or vomiting or abdominal pain.   No bleeding diathesis.  She used to be on hydrochlorothiazide and stopped it due to hyponatremia.  She was having bilateral earache more on the left than the right.  She admits to urinary frequency without urgency or dysuria or hematuria or flank pain.  ED Course: Upon presentation to the emergency room blood pressure was 169/101 and later 189/110 with normal respiratory rate and later 26.  Labs revealed unremarkable BMP and CBC.  UA was negative.  Respiratory panel is currently pending.  EKG as reviewed by me : Showed normal sinus rhythm with Alera Quevedo rate of 71. Imaging: Brain MRI without contrast revealed no evidence for recent infarction, hemorrhage or mass.  The patient was given 5 mg of  p.o. Norvasc.  She will be admitted to an observation medically monitored bed for further evaluation and management.  She was admitted for lightheadedness over past several days.  She was notably hypertensive on presentation to the ED.  She was started on amlodipine and had workup including MRI brain (unremarkable), carotid dopplers (1-49% bilaterally), and echo (results pending at discharge).  Symptoms resolved at the time of discharge.    See below for additional details  Hospital Course:  1.  Lightheadedness  Presyncope  Unsteady Gait - she notes symptoms of lightheadedness since around Thursday - denies vertiginous symptoms - notes lightheadedness that she told me was worse later in day - ? Orthostatic vs other causes, though she had hypertension on presentation here - odd constellation of symptoms without readily apparent etiology for me at this time.  - improved symptoms at time of discharge - MRI without evidence of infarction, hemorrhage, or mass - Carotid US with 1-49% stenosis bilaterally - orthostatics negative - EKG with NSR - echo was done and is pending at the time of discharge (reached out to cards regarding read, pending - I don't have compelling reason at this time to hold her only pending read, will follow with cards and follow final echo results).  Discussed need to follow with cardiology outpatient.  - she does note stress -> physical manifestation of stress would be possibility, though would want to rule out all other potential causes before attributing symptoms to this - no follow up per PT/OT  -- addendum - echo with normal EF - discussed  results with pt after d/c 7/4  # Hypertensive Urgency - unlikely cause of above symptoms - improved on day of discharge - started on amlodipine in addition to losartan - follow outpatient BP's  3.  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 - We will continue her Klonopin, Prozac, aripiprazole and Seroquel. - follow  with psychiatry outpatient with stress   4.  Dyslipidemia. - We will continue statin therapy.  Procedures: Echo pending at time of discharge  Carotid US IMPRESSION: 1. Mild (1-49%) stenosis proximal right internal carotid artery secondary to mild focal heterogeneous atherosclerotic plaque. 2. Mild (1-49%) stenosis proximal left internal carotid artery secondary to moderate irregular heterogeneous atherosclerotic plaque. 3. Vertebral arteries are patent with normal antegrade flow. Consultations: cardiology  Discharge Exam: Vitals:   09/06/20 1542 09/06/20 1755  BP: 126/70 130/71  Pulse: 88 87  Resp: 18 17  Temp: 98.2 F (36.8 C) 98.1 F (36.7 C)  SpO2: 98% 98%   No complaints at this time Acknowledges stress - father died 6-7 months ago, mother ill - tearful at times with this Discussed d/c plan and recommendations  General: No acute distress. Cardiovascular: Heart sounds show Kelvin Sennett regular rate, and rhythm. Lungs: Clear to auscultation bilaterally  Abdomen: Soft, nontender, nondistended  Neurological: Alert and oriented 3. Moves all extremities 4 . Cranial nerves II through XII grossly intact. Skin: Warm and dry. No rashes or lesions. Extremities: No clubbing or cyanosis. No edema.   Discharge Instructions   Discharge Instructions     Call MD for:  difficulty breathing, headache or visual disturbances   Complete by: As directed    Call MD for:  difficulty breathing, headache or visual disturbances   Complete by: As directed    Call MD for:  extreme fatigue   Complete by: As directed    Call MD for:  extreme fatigue   Complete by: As directed    Call MD for:  hives   Complete by: As directed    Call MD for:  hives   Complete by: As directed    Call MD for:  persistant dizziness or light-headedness   Complete by: As directed    Call MD for:  persistant dizziness or light-headedness   Complete by: As directed    Call MD for:  persistant nausea and vomiting    Complete by: As directed    Call MD for:  persistant nausea and vomiting   Complete by: As directed    Call MD for:  redness, tenderness, or signs of infection (pain, swelling, redness, odor or green/yellow discharge around incision site)   Complete by: As directed    Call MD for:  redness, tenderness, or signs of infection (pain, swelling, redness, odor or green/yellow discharge around incision site)   Complete by: As directed    Call MD for:  severe uncontrolled pain   Complete by: As directed    Call MD for:  severe uncontrolled pain   Complete by: As directed    Call MD for:  temperature >100.4   Complete by: As directed    Call MD for:  temperature >100.4   Complete by: As directed    Diet - low sodium heart healthy   Complete by: As directed    Diet - low sodium heart healthy   Complete by: As directed    Discharge instructions   Complete by: As directed    You were seen for "lightheadedness".  It's not quite clear to me what  was causing this symptom for you.  Your blood pressure was high when you came in, but typically, high blood pressure doesn't cause Alucard Fearnow sensation of lightheadedness.   You had an MRI that was reassuring.  Your blood pressures standing, sitting, and lying down were normal today.  Your blood pressure is much improved today.  We started you on Tyliah Schlereth blood pressure medicine.  I'll continue this at discharge for you, but keep an eye on your symptoms and blood pressure as we add this new medicine.   Your echocardiogram is pending at the time of discharge.  Please follow up with your cardiologist and your PCP regarding the results.  Your carotid dopplers show 1-49% stenosis bilaterally.  Please follow up those results with your PCP and cardiologist.   It sounds like you have Bernice Mullin lot of underlying stress.  Continue to follow up with your psychiatrist.  Sometimes stress can have physical manifestations, though I'm not sure if there's another different cause of your  lightheadedness.  Return for new, recurrent, or worsening symptoms.   Please ask your PCP to request records from this hospitalization so they know what was done and what the next steps will be.   Increase activity slowly   Complete by: As directed    Increase activity slowly   Complete by: As directed       Allergies as of 09/06/2020       Reactions   Oxcarbazepine Other (See Comments)   Hyponatremia   Pork-derived Products         Medication List     STOP taking these medications    clonazePAM 0.5 MG tablet Commonly known as: KLONOPIN       TAKE these medications    amLODipine 5 MG tablet Commonly known as: NORVASC Take 1 tablet (5 mg total) by mouth daily.   aspirin EC 81 MG tablet Take 81 mg by mouth daily.   atorvastatin 40 MG tablet Commonly known as: LIPITOR Take 40 mg by mouth daily.   diazepam 10 MG tablet Commonly known as: VALIUM Take 10 mg by mouth 3 (three) times daily as needed.   hydrOXYzine 25 MG tablet Commonly known as: ATARAX/VISTARIL Take 25 mg by mouth every 4 (four) hours as needed for sleep.   losartan 100 MG tablet Commonly known as: COZAAR Take 1 tablet (100 mg total) by mouth daily.   QUEtiapine 50 MG tablet Commonly known as: SEROQUEL Take 50 mg by mouth daily.       ASK your doctor about these medications    cholecalciferol 25 MCG (1000 UNIT) tablet Commonly known as: VITAMIN D3 Take 1 tablet (1,000 Units total) by mouth daily.       Allergies  Allergen Reactions   Oxcarbazepine Other (See Comments)    Hyponatremia   Pork-Derived Products       The results of significant diagnostics from this hospitalization (including imaging, microbiology, ancillary and laboratory) are listed below for reference.    Significant Diagnostic Studies: MR BRAIN WO CONTRAST  Result Date: 09/05/2020 CLINICAL DATA:  Vertigo EXAM: MRI HEAD WITHOUT CONTRAST TECHNIQUE: Multiplanar, multiecho pulse sequences of the brain and  surrounding structures were obtained without intravenous contrast. COMPARISON:  None. FINDINGS: Brain: There is no acute infarction or intracranial hemorrhage. There is no intracranial mass, mass effect, or edema. There is no hydrocephalus or extra-axial fluid collection. Ventricles and sulci are normal in size and configuration. Vascular: Major vessel flow voids at the skull base are preserved. Skull and  upper cervical spine: Normal marrow signal is preserved. Sinuses/Orbits: Paranasal sinuses are aerated. Orbits are unremarkable. Other: Sella is partially empty.  Mastoid air cells are clear. IMPRESSION: No evidence of recent infarction, hemorrhage, or mass. Electronically Signed   By: Macy Mis M.D.   On: 09/05/2020 18:28   US Carotid Bilateral  Result Date: 09/06/2020 CLINICAL DATA:  Left carotid artery bruit EXAM: BILATERAL CAROTID DUPLEX ULTRASOUND TECHNIQUE: Pearline Cables scale imaging, color Doppler and duplex ultrasound were performed of bilateral carotid and vertebral arteries in the neck. COMPARISON:  None. FINDINGS: Criteria: Quantification of carotid stenosis is based on velocity parameters that correlate the residual internal carotid diameter with NASCET-based stenosis levels, using the diameter of the distal internal carotid lumen as the denominator for stenosis measurement. The following velocity measurements were obtained: RIGHT ICA: 75/26 cm/sec CCA: 21/19 cm/sec SYSTOLIC ICA/CCA RATIO:  1.4 ECA:  92 cm/sec LEFT ICA: 88/23 cm/sec CCA: 41/74 cm/sec SYSTOLIC ICA/CCA RATIO:  1.4 ECA:  77 cm/sec RIGHT CAROTID ARTERY: Trace heterogeneous atherosclerotic plaque in the proximal internal carotid artery. By peak systolic velocity criteria, the estimated stenosis is less than 50%. RIGHT VERTEBRAL ARTERY:  Patent with normal antegrade flow. LEFT CAROTID ARTERY: Irregular heterogeneous atherosclerotic plaque in the proximal internal carotid artery. By peak systolic velocity criteria, the estimated stenosis is  less than 50%. LEFT VERTEBRAL ARTERY:  Patent with normal antegrade flow. IMPRESSION: 1. Mild (1-49%) stenosis proximal right internal carotid artery secondary to mild focal heterogeneous atherosclerotic plaque. 2. Mild (1-49%) stenosis proximal left internal carotid artery secondary to moderate irregular heterogeneous atherosclerotic plaque. 3. Vertebral arteries are patent with normal antegrade flow. Signed, Criselda Peaches, MD, [facility] Electronically Signed   By: Jacqulynn Cadet M.D.   On: 09/06/2020 11:29    Microbiology: Recent Results (from the past 240 hour(s))  Resp Panel by RT-PCR (Flu Kolbee Bogusz&B, Covid) Nasopharyngeal Swab     Status: None   Collection Time: 09/05/20  7:32 PM   Specimen: Nasopharyngeal Swab; Nasopharyngeal(NP) swabs in vial transport medium  Result Value Ref Range Status   SARS Coronavirus 2 by RT PCR NEGATIVE NEGATIVE Final    Comment: (NOTE) SARS-CoV-2 target nucleic acids are NOT DETECTED.  The SARS-CoV-2 RNA is generally detectable in upper respiratory specimens during the acute phase of infection. The lowest concentration of SARS-CoV-2 viral copies this assay can detect is 138 copies/mL. Adeena Bernabe negative result does not preclude SARS-Cov-2 infection and should not be used as the sole basis for treatment or other patient management decisions. Judy Goodenow negative result may occur with  improper specimen collection/handling, submission of specimen other than nasopharyngeal swab, presence of viral mutation(s) within the areas targeted by this assay, and inadequate number of viral copies(<138 copies/mL). Amias Hutchinson negative result must be combined with clinical observations, patient history, and epidemiological information. The expected result is Negative.  Fact Sheet for Patients:  EntrepreneurPulse.com.au  Fact Sheet for Healthcare Providers:  IncredibleEmployment.be  This test is  no t yet approved or cleared by the [place] FDA and  has been authorized for detection and/or diagnosis of SARS-CoV-2 by FDA under an Emergency Use Authorization (EUA). This EUA will remain  in effect (meaning this test can be used) for the duration of the COVID-19 declaration under Section 564(b)(1) of the Act, 21 U.S.C.section 360bbb-3(b)(1), unless the authorization is terminated  or revoked sooner.       Influenza Tykeshia Tourangeau by PCR NEGATIVE NEGATIVE Final   Influenza B by PCR NEGATIVE NEGATIVE  Final    Comment: (NOTE) The Xpert Xpress SARS-CoV-2/FLU/RSV plus assay is intended as an aid in the diagnosis of influenza from Nasopharyngeal swab specimens and should not be used as Xiao Graul sole basis for treatment. Nasal washings and aspirates are unacceptable for Xpert Xpress SARS-CoV-2/FLU/RSV testing.  Fact Sheet for Patients: EntrepreneurPulse.com.au  Fact Sheet for Healthcare Providers: IncredibleEmployment.be  This test is not yet approved or cleared by the [place] FDA and has been authorized for detection and/or diagnosis of SARS-CoV-2 by FDA under an Emergency Use Authorization (EUA). This EUA will remain in effect (meaning this test can be used) for the duration of the COVID-19 declaration under Section 564(b)(1) of the Act, 21 U.S.C. section 360bbb-3(b)(1), unless the authorization is terminated or revoked.  Performed at [facility], [address] [place], [place] [address]      Labs: Basic Metabolic Panel: Recent Labs  Lab 09/05/20 1331 09/06/20 0628  NA 136 140  K 3.7 4.1  CL 106 106  CO2 22 25  GLUCOSE 96 91  BUN 16 13  CREATININE 1.10* 1.13*  CALCIUM 10.1 10.1   Liver Function Tests: No results for input(s): AST, ALT, ALKPHOS, BILITOT, PROT, ALBUMIN in the last 168 hours. No results for input(s): LIPASE, AMYLASE in the last 168 hours. No results for input(s): AMMONIA in the last 168 hours. CBC: Recent  Labs  Lab 09/05/20 1331 09/06/20 0628  WBC 7.4 6.7  HGB 13.8 13.4  HCT 41.1 40.3  MCV 94.5 93.7  PLT 271 267   Cardiac Enzymes: No results for input(s): CKTOTAL, CKMB, CKMBINDEX, TROPONINI in the last 168 hours. BNP: BNP (last 3 results) No results for input(s): BNP in the last 8760 hours.  ProBNP (last 3 results) No results for input(s): PROBNP in the last 8760 hours.  CBG: No results for input(s): GLUCAP in the last 168 hours.     Signed:  Fayrene Helper MD.  [facility] 09/06/2020, 6:17 PM

## 2020-09-24 ENCOUNTER — Encounter: Payer: Self-pay | Admitting: Emergency Medicine

## 2020-09-24 ENCOUNTER — Emergency Department
Admission: EM | Admit: 2020-09-24 | Discharge: 2020-09-24 | Disposition: A | Discharge status: Home / Self Care | Payer: Medicare HMO | Attending: Emergency Medicine | Admitting: Emergency Medicine

## 2020-09-24 ENCOUNTER — Other Ambulatory Visit: Payer: Self-pay

## 2020-09-24 ENCOUNTER — Emergency Department: Payer: Medicare HMO

## 2020-09-24 DIAGNOSIS — Z20822 Contact with and (suspected) exposure to covid-19: Secondary | ICD-10-CM | POA: Diagnosis not present

## 2020-09-24 DIAGNOSIS — F17210 Nicotine dependence, cigarettes, uncomplicated: Secondary | ICD-10-CM | POA: Insufficient documentation

## 2020-09-24 DIAGNOSIS — R42 Dizziness and giddiness: Secondary | ICD-10-CM | POA: Diagnosis not present

## 2020-09-24 DIAGNOSIS — R0602 Shortness of breath: Secondary | ICD-10-CM | POA: Insufficient documentation

## 2020-09-24 DIAGNOSIS — Z79899 Other long term (current) drug therapy: Secondary | ICD-10-CM | POA: Insufficient documentation

## 2020-09-24 DIAGNOSIS — Z7982 Long term (current) use of aspirin: Secondary | ICD-10-CM | POA: Insufficient documentation

## 2020-09-24 DIAGNOSIS — N183 Chronic kidney disease, stage 3 unspecified: Secondary | ICD-10-CM | POA: Diagnosis not present

## 2020-09-24 DIAGNOSIS — R55 Syncope and collapse: Secondary | ICD-10-CM | POA: Diagnosis not present

## 2020-09-24 DIAGNOSIS — I129 Hypertensive chronic kidney disease with stage 1 through stage 4 chronic kidney disease, or unspecified chronic kidney disease: Secondary | ICD-10-CM | POA: Diagnosis not present

## 2020-09-24 DIAGNOSIS — Z96611 Presence of right artificial shoulder joint: Secondary | ICD-10-CM | POA: Insufficient documentation

## 2020-09-24 LAB — HEPATIC FUNCTION PANEL
ALT: 12 U/L (ref 0–44)
AST: 19 U/L (ref 15–41)
Albumin: 4.6 g/dL (ref 3.5–5.0)
Alkaline Phosphatase: 64 U/L (ref 38–126)
Bilirubin, Direct: <0.1 mg/dL (ref 0.0–0.2)
Total Bilirubin: 0.8 mg/dL (ref 0.3–1.2)
Total Protein: 7.6 g/dL (ref 6.5–8.1)

## 2020-09-24 LAB — BASIC METABOLIC PANEL
Anion gap: 9 (ref 5–15)
BUN: 19 mg/dL (ref 8–23)
CO2: 23 mmol/L (ref 22–32)
Calcium: 10.3 mg/dL (ref 8.9–10.3)
Chloride: 105 mmol/L (ref 98–111)
Creatinine, Ser: 1.13 mg/dL — ABNORMAL HIGH (ref 0.44–1.00)
GFR, Estimated: 54 mL/min — ABNORMAL LOW (ref >60)
Glucose, Bld: 107 mg/dL — ABNORMAL HIGH (ref 70–99)
Potassium: 4.1 mmol/L (ref 3.5–5.1)
Sodium: 137 mmol/L (ref 135–145)

## 2020-09-24 LAB — RESP PANEL BY RT-PCR (FLU A&B, COVID) ARPGX2
Influenza A by PCR: NEGATIVE
Influenza B by PCR: NEGATIVE
SARS Coronavirus 2 by RT PCR: NEGATIVE

## 2020-09-24 LAB — CBC
HCT: 41.6 % (ref 36.0–46.0)
Hemoglobin: 13.7 g/dL (ref 12.0–15.0)
MCH: 31.2 pg (ref 26.0–34.0)
MCHC: 32.9 g/dL (ref 30.0–36.0)
MCV: 94.8 fL (ref 80.0–100.0)
Platelets: 264 10*3/uL (ref 150–400)
RBC: 4.39 MIL/uL (ref 3.87–5.11)
RDW: 14.5 % (ref 11.5–15.5)
WBC: 7.1 10*3/uL (ref 4.0–10.5)
nRBC: 0 % (ref 0.0–0.2)

## 2020-09-24 LAB — D-DIMER, QUANTITATIVE: D-Dimer, Quant: 0.36 ug/mL-FEU (ref 0.00–0.50)

## 2020-09-24 LAB — TROPONIN I (HIGH SENSITIVITY): Troponin I (High Sensitivity): <2 ng/L (ref <18)

## 2020-09-24 MED ORDER — MECLIZINE HCL 25 MG PO TABS: 25.0000 mg | ORAL_TABLET | Freq: Three times a day (TID) | ORAL | 0 refills | Status: AC | PRN

## 2020-09-24 MED ORDER — MECLIZINE HCL 25 MG PO TABS
25.0000 mg | ORAL_TABLET | Freq: Once | ORAL | Status: AC
  Administered 2020-09-24: 25 mg via ORAL
  Filled 2020-09-24: qty 1

## 2020-09-24 NOTE — ED Provider Notes (Signed)
[facility] Emergency Department Provider Note  ____________________________________________   Event Date/Time   First MD Initiated Contact with Patient 09/24/20 1720     (approximate)  I have reviewed the triage vital signs and the nursing notes.   HISTORY  Chief Complaint Weakness and Shortness of Breath    HPI Erin Lewis is a 65 y.o. female with CKD, hypertension, hyperlipidemia depression and anxiety on multiple medications who comes in with concerns for dizziness.  Patient reports that she has had continued dizziness since she was last seen here in the emergency room in early July.  She states the dizziness is not at rest but more when she stands up.  She has a hard time quantifying exactly what the dizziness feels like.  It is intermittent, worse with standing, better at rest.  Denies any chest pain but does report new shortness of breath over the past few days.  Denies any known COVID contacts.  Denies any falls hitting her head.  She states that she just feels a fullness in her ears.  She was seen by her primary care doctor and they started her on amoxicillin  Patient reports being admitted observation on 7/2 for dizziness.  At that time her blood pressure was noted to be 169/101.  Patient had an MRI of her brain that was negative.  Given she developed some near syncope with standing they admitted her to the hospital.  She had some grade 1 diastolic dysfunctioning but was otherwise reassuring.  She also had carotid ultrasounds that showed mild stenosis bilaterally.          Past Medical History:  Diagnosis Date   Alcohol abuse    Anxiety    Carotid arterial disease ([place])    a. 09/2013 U/S: <50% bilat ICA stenosis.   CKD (chronic kidney disease), stage III ([place])    Depression    Hyperlipidemia    Hypertension    Polysubstance abuse ([place])    Tobacco abuse    Weakness    a. 12/2012 admit w/ L sided wkns-->No CVA; b. 12/2012 Echo: EF 60-65%,  nl RV fxn.    Patient Active Problem List   Diagnosis Date Noted   Hypertensive urgency 09/05/2020   Acute hyponatremia 05/29/2019   Dizziness    Alcohol use disorder, moderate, dependence ([place]) 05/20/2018   Recurrent major depression-severe ([place]) 05/20/2018   Rectal bleeding 07/11/2016   Sensory deficit, left 03/11/2016   HTN (hypertension) 03/11/2016   Expressive aphasia 03/11/2016   Weakness of left lower extremity 03/11/2016   Major depressive disorder, recurrent, severe without psychotic features ([place]) 10/07/2014   Anxiety reaction 10/07/2014    Past Surgical History:  Procedure Laterality Date   [facility]   CESAREAN SECTION     x2   COLONOSCOPY WITH PROPOFOL N/A 07/20/2016   Procedure: COLONOSCOPY WITH PROPOFOL;  Surgeon: Robert Bellow, MD;  Location: [facility] ENDOSCOPY;  Service: Endoscopy;  Laterality: N/A;   laproscopy     abdomen   SHOULDER ARTHROSCOPY W/ ROTATOR CUFF REPAIR     x2   TOTAL SHOULDER ARTHROPLASTY Right 6-7 years ago    Prior to Admission medications   Medication Sig Start Date End Date Taking? Authorizing Provider  amLODipine (NORVASC) 5 MG tablet Take 1 tablet (5 mg total) by mouth daily. 09/06/20 10/06/20  Elodia Florence., MD  aspirin EC 81 MG tablet Take 81 mg by mouth daily.    [provider]  atorvastatin (LIPITOR) 40 MG tablet Take 40 mg by mouth daily.  09/05/18   [provider]  cholecalciferol (VITAMIN D3) 25 MCG (1000 UT) tablet Take 1 tablet (1,000 Units total) by mouth daily. Patient taking differently: Take 1,000 Units by mouth every 7 (seven) days. On Tuesdays 05/23/18   Clapacs, Madie [place], MD  diazepam (VALIUM) 10 MG tablet Take 10 mg by mouth 3 (three) times daily as needed. 09/01/20   [provider]  hydrOXYzine (ATARAX/VISTARIL) 25 MG tablet Take 25 mg by mouth every 4 (four) hours as needed for sleep. 12/26/16   [provider]  losartan (COZAAR) 100 MG tablet Take  1 tablet (100 mg total) by mouth daily. 05/31/19   Lorella Nimrod, MD  QUEtiapine (SEROQUEL) 50 MG tablet Take 50 mg by mouth daily. 08/17/20   [provider]  clonazePAM (KLONOPIN) 0.5 MG tablet Take 0.5 mg by mouth 2 (two) times daily as needed for anxiety.  Patient not taking: Reported on 09/05/2020 05/05/18 09/06/20  [provider]    Allergies Oxcarbazepine and Pork-derived products  Family History  Problem Relation Age of Onset   Diabetes Mother    Kidney disease Father    Diabetes Father    Heart attack Father    Colon polyps Father 85   Hypertension Other    Diabetes Other    Breast cancer Neg Hx     Social History Social History   Tobacco Use   Smoking status: Every Day    Packs/day: 0.50    Years: 45.00    Pack years: 22.50    Types: Cigarettes   Smokeless tobacco: Never   Tobacco comments:    Currently smoking about 4 cigarettes/day but smoked more previously.  Vaping Use   Vaping Use: Never used  Substance Use Topics   Alcohol use: Yes    Comment: Occasionally now; previously drank heavily.   Drug use: Yes    Types: Marijuana    Comment: Occasionaly smokes pot.      Review of Systems Constitutional: No fever/chills Eyes: No visual changes. ENT: No sore throat.  Ear pressure Cardiovascular: No chest pain dizziness Respiratory: Positive for SOB Gastrointestinal: No abdominal pain.  No nausea, no vomiting.  No diarrhea.  No constipation. Genitourinary: Negative for dysuria. Musculoskeletal: Negative for back pain. Skin: Negative for rash. Neurological: Negative for headaches, focal weakness or numbness. All other ROS negative ____________________________________________   PHYSICAL EXAM:  VITAL SIGNS: ED Triage Vitals  Enc Vitals Group     BP 09/24/20 1650 115/80     Pulse Rate 09/24/20 1650 79     Resp 09/24/20 1650 20     Temp 09/24/20 1653 99.3 F (37.4 C)     Temp Source 09/24/20 1653 Oral     SpO2 09/24/20 1650 100 %      Weight 09/24/20 1650 149 lb (67.6 kg)     Height 09/24/20 1650 5\' 5"  (1.651 m)     Head Circumference --      Peak Flow --      Pain Score 09/24/20 1650 0     Pain Loc --      Pain Edu? --      Excl. in [place]? --     Constitutional: Alert and oriented. Well appearing and in no acute distress. Eyes: Conjunctivae are normal. EOMI. no nystagmus noted.  Pupils equal and reactive Head: Atraumatic. Nose: No congestion/rhinnorhea. Mouth/Throat: Mucous membranes are moist.  TMs are visualized bilaterally.  There is a little bit of wax on the left side but not impacted. Neck: No stridor. Trachea Midline. FROM Cardiovascular: Normal rate, regular rhythm. Grossly normal heart sounds.  Good peripheral circulation. Respiratory: Clear lungs, no increased work of breathing Gastrointestinal: Soft and nontender. No distention. No abdominal bruits.  Musculoskeletal: No lower extremity tenderness nor edema.  No joint effusions. Neurologic:  Normal speech and language. No gross focal neurologic deficits are appreciated.  Cranial nerves are intact.  Equal strength in arms and legs.  Finger-to-nose intact.  Negative Romberg.  Patient able to ambulate without any ataxia. Skin:  Skin is warm, dry and intact. No rash noted. Psychiatric: Mood and affect are normal. Speech and behavior are normal. GU: Deferred   ____________________________________________   LABS (all labs ordered are listed, but only abnormal results are displayed)  Labs Reviewed  BASIC METABOLIC PANEL - Abnormal; Notable for the following components:      Result Value   Glucose, Bld 107 (*)    Creatinine, Ser 1.13 (*)    GFR, Estimated 54 (*)    All other components within normal limits  RESP PANEL BY RT-PCR (FLU A&B, COVID) ARPGX2  CBC  URINALYSIS, COMPLETE (UACMP) WITH MICROSCOPIC  CBG MONITORING, ED   ____________________________________________   ED ECG REPORT I, Vanessa [place], the attending physician, personally viewed and  interpreted this ECG.  Normal sinus rate of 77, no ST elevation, no T wave inversions, normal intervals ____________________________________________  RADIOLOGY Robert Bellow, personally viewed and evaluated these images (plain radiographs) as part of my medical decision making, as well as reviewing the written report by the radiologist.  ED MD interpretation:  No pna   Official radiology report(s): DG Chest 2 View  Result Date: 09/24/2020 CLINICAL DATA:  Weakness, shortness of breath EXAM: CHEST - 2 VIEW COMPARISON:  05/29/2019 FINDINGS: Single frontal view of the chest demonstrates an unremarkable cardiac silhouette. No airspace disease, effusion, or pneumothorax. No acute bony abnormalities. Stable right shoulder arthroplasty. IMPRESSION: 1. No acute intrathoracic process. Electronically Signed   By: Randa Ngo M.D.   On: 09/24/2020 18:21    ____________________________________________   PROCEDURES  Procedure(s) performed (including Critical Care):  Procedures   ____________________________________________   INITIAL IMPRESSION / ASSESSMENT AND PLAN / ED COURSE   [place] was evaluated in Emergency Department on 09/24/2020 for the symptoms described in the history of present illness. She was evaluated in the context of the global COVID-19 pandemic, which necessitated consideration that the patient might be at risk for infection with the SARS-CoV-2 virus that causes COVID-19. Institutional protocols and algorithms that pertain to the evaluation of patients at risk for COVID-19 are in a state of rapid change based on information released by regulatory bodies including the CDC and federal and state organizations. These policies and algorithms were followed during the patient's care in the ED.     Patient is already had a pretty extensive work-up for this dizziness including MRI, echo, Dopplers, thyroid testing have all been reassuring.  However she now has new shortness  of breath so we will get D-dimer to evaluate for PE, COVID swab, chest x-ray to evaluate for pneumonia, pneumothorax, EKG and cardiac markers evaluate for ACS.  This could be vertigo given she reports some fullness in her ears and worse with head movements.  We will trial some meclizine and patient will need referral for ENT   Patient's COVID is negative.  Labs are reassuring.  No evidence of AKI.  Ddimer negative. Troponin is negative is been going on for greater than 3 hours.  Patient feels better after the meclizine.  Patient is ambulatory without significant symptoms.  At this time she feels comfortable going home and will follow up with ENT.   I discussed the provisional nature of ED diagnosis, the treatment so far, the ongoing plan of care, follow up appointments and return precautions with the patient and any family or support people present. They expressed understanding and agreed with the plan, discharged home.    ____________________________________________   FINAL CLINICAL IMPRESSION(S) / ED DIAGNOSES   Final diagnoses:  Vertigo     MEDICATIONS GIVEN DURING THIS VISIT:  Medications  meclizine (ANTIVERT) tablet 25 mg (25 mg Oral Given 09/24/20 1853)     ED Discharge Orders          Ordered    meclizine (ANTIVERT) 25 MG tablet  3 times daily PRN        09/24/20 1936             Note:  This document was prepared using Dragon voice recognition software and may include unintentional dictation errors.   Vanessa [place], MD 09/24/20 [phone]

## 2020-09-24 NOTE — ED Triage Notes (Signed)
Pt reports feels weak and SOB. Pt reports has felt this way for awhile and was observed overnight here and put on a beta blocker for her BP. Pt reports that feeling has not went away and she is still weak, sometimes dizzy and feels SOB.

## 2020-09-24 NOTE — Discharge Instructions (Addendum)
Your work-up here was reassuring and your work-up previously does not show any signs of life-threatening illnesses.  He can follow-up with ENT.  Prescribed some meclizine in case this is vertigo.  Return to the ER if develop any other concerns or worsening symptoms

## 2020-10-01 IMAGING — MG DIGITAL SCREENING BILATERAL MAMMOGRAM WITH TOMO AND CAD
8 series · 8 of 24 positions shown · non-contrast
Comparison: Previous exam(s).

CLINICAL DATA: Screening.

EXAM:
DIGITAL SCREENING BILATERAL MAMMOGRAM WITH TOMO AND CAD

[R CC synth-2D]
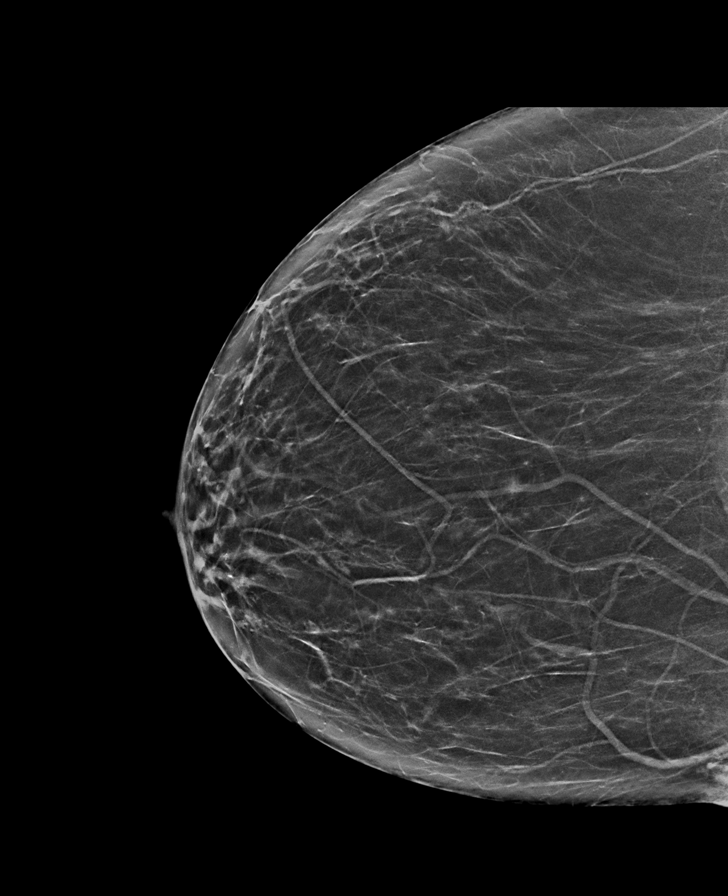

[L MLO synth-2D]
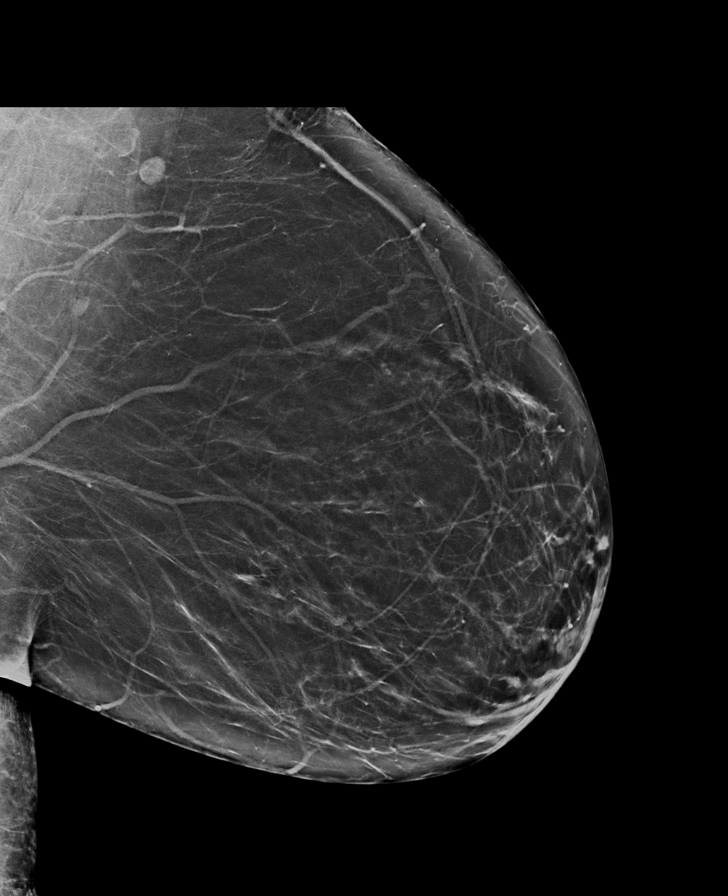

[R MLO synth-2D]
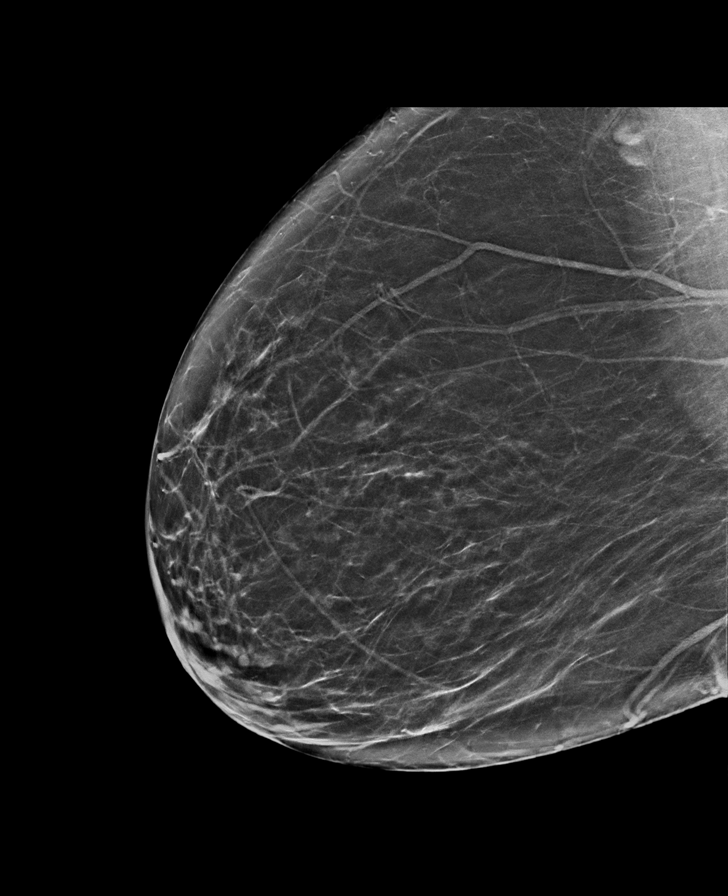

[L CC synth-2D]
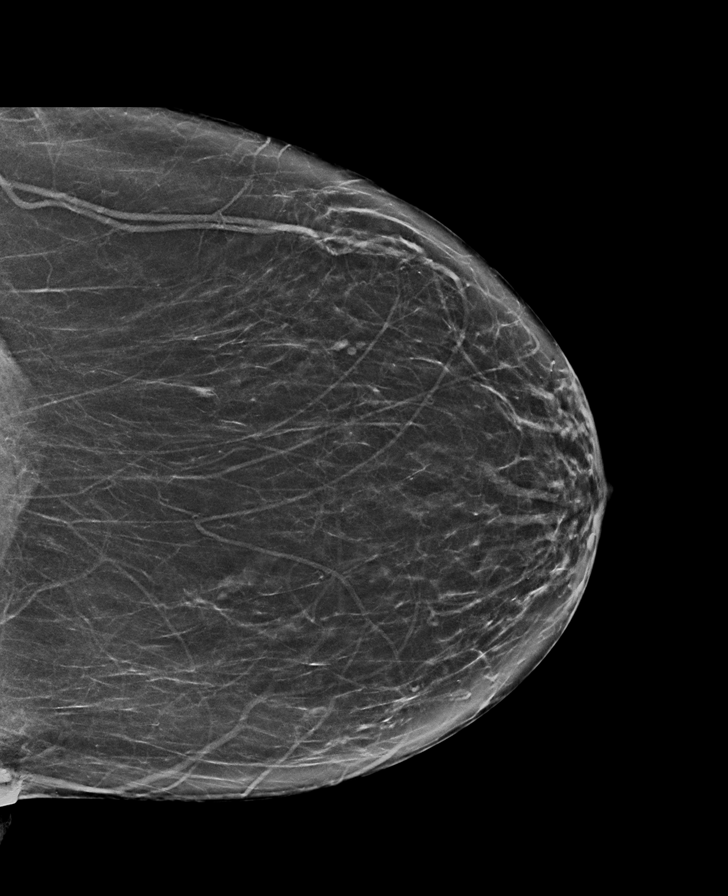

[L CC tomo · tomo slice 33/66.0]
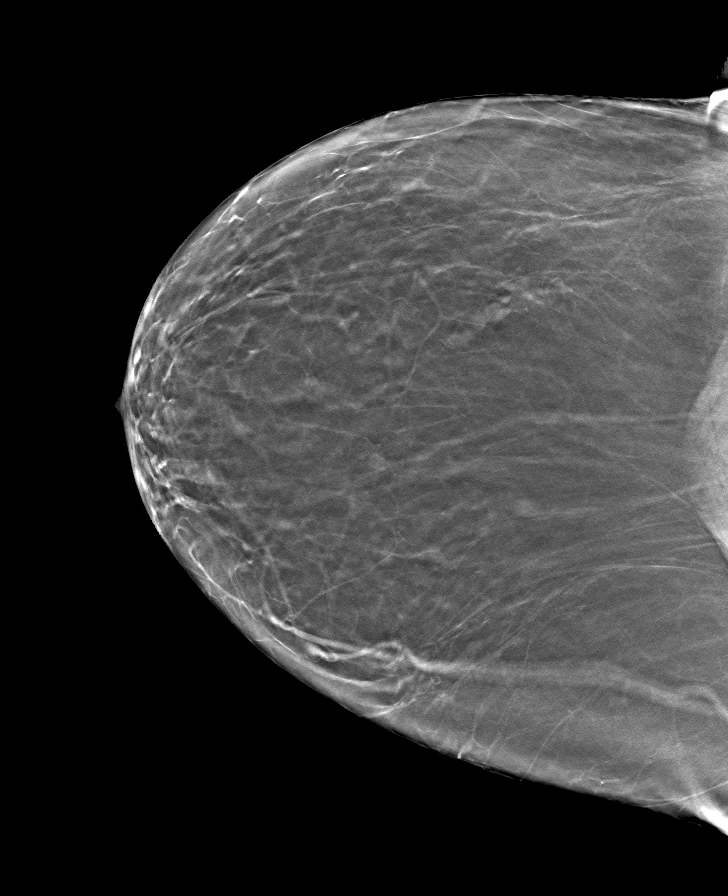

[L MLO tomo · tomo slice 38/75.0]
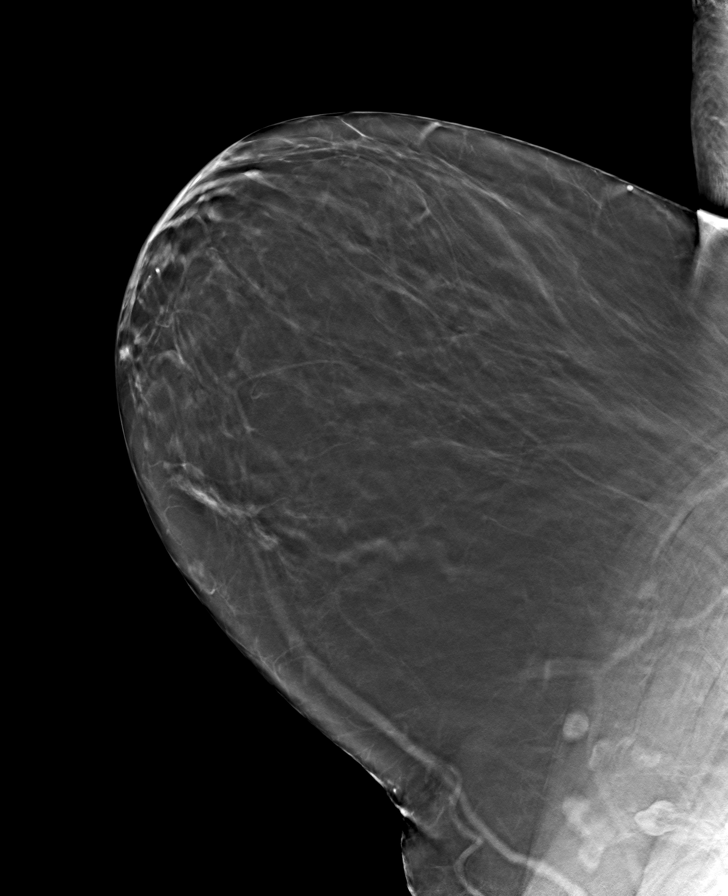

[R CC tomo · tomo slice 32/63.0]
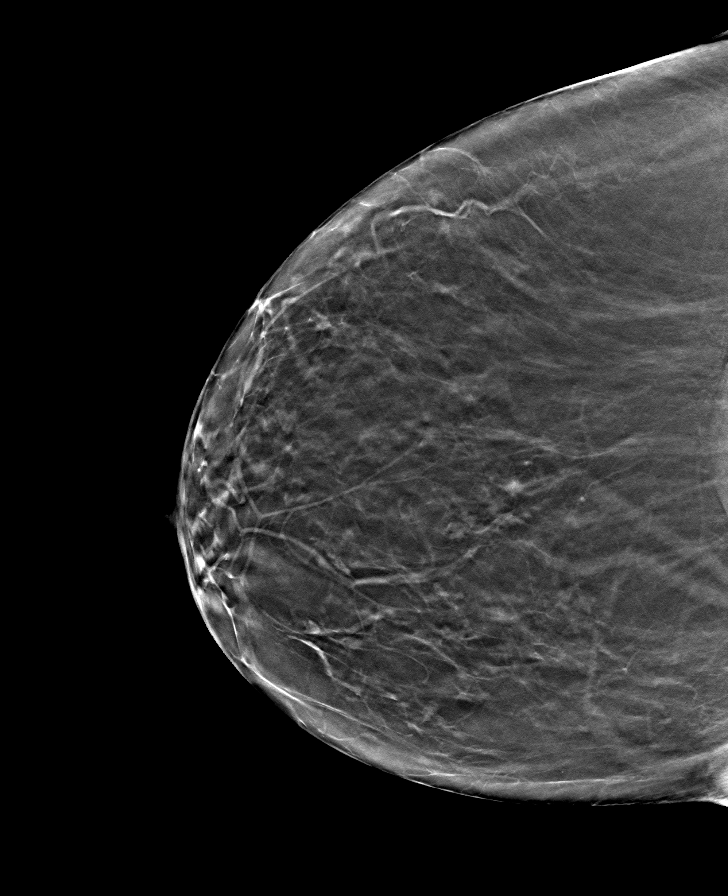

[R MLO tomo · tomo slice 35/69.0]
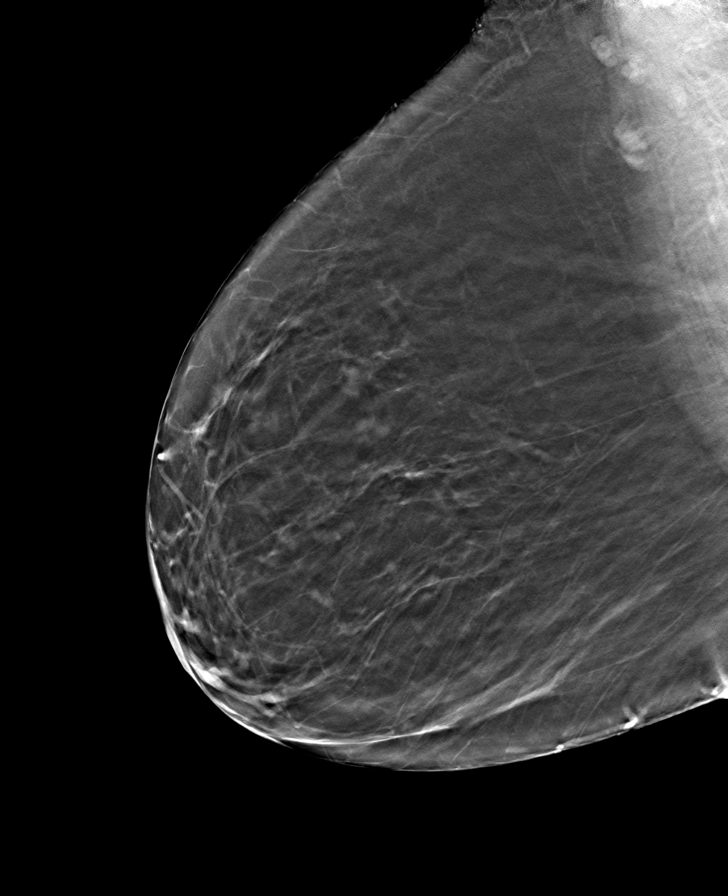

[8 of 24 positions shown; findings below may reference images not displayed]

ACR Breast Density Category b: There are scattered areas of
fibroglandular density.
FINDINGS: There are no findings suspicious for malignancy. Images were
processed with CAD.
IMPRESSION: No mammographic evidence of malignancy. A result letter of this
screening mammogram will be mailed directly to the patient.

RECOMMENDATION:
Screening mammogram in one year. (Code:[id number])

BI-RADS CATEGORY  1: Negative.

## 2021-03-05 ENCOUNTER — Emergency Department: Payer: Medicare HMO

## 2021-03-05 ENCOUNTER — Emergency Department
Admission: EM | Admit: 2021-03-05 | Discharge: 2021-03-06 | Disposition: A | Discharge status: Home / Self Care | Payer: Medicare HMO | Attending: Emergency Medicine | Admitting: Emergency Medicine

## 2021-03-05 ENCOUNTER — Other Ambulatory Visit: Payer: Self-pay

## 2021-03-05 DIAGNOSIS — M6281 Muscle weakness (generalized): Secondary | ICD-10-CM | POA: Insufficient documentation

## 2021-03-05 DIAGNOSIS — Z5321 Procedure and treatment not carried out due to patient leaving prior to being seen by health care provider: Secondary | ICD-10-CM | POA: Diagnosis not present

## 2021-03-05 DIAGNOSIS — U071 COVID-19: Secondary | ICD-10-CM | POA: Diagnosis not present

## 2021-03-05 DIAGNOSIS — R42 Dizziness and giddiness: Secondary | ICD-10-CM | POA: Diagnosis present

## 2021-03-05 DIAGNOSIS — R109 Unspecified abdominal pain: Secondary | ICD-10-CM | POA: Insufficient documentation

## 2021-03-05 LAB — CBC WITH DIFFERENTIAL/PLATELET
Abs Immature Granulocytes: 0.02 10*3/uL (ref 0.00–0.07)
Basophils Absolute: 0 10*3/uL (ref 0.0–0.1)
Basophils Relative: 0 %
Eosinophils Absolute: 0.1 10*3/uL (ref 0.0–0.5)
Eosinophils Relative: 1 %
HCT: 45.1 % (ref 36.0–46.0)
Hemoglobin: 15.6 g/dL — ABNORMAL HIGH (ref 12.0–15.0)
Immature Granulocytes: 0 %
Lymphocytes Relative: 27 %
Lymphs Abs: 2.3 10*3/uL (ref 0.7–4.0)
MCH: 31 pg (ref 26.0–34.0)
MCHC: 34.6 g/dL (ref 30.0–36.0)
MCV: 89.5 fL (ref 80.0–100.0)
Monocytes Absolute: 0.4 10*3/uL (ref 0.1–1.0)
Monocytes Relative: 4 %
Neutro Abs: 5.6 10*3/uL (ref 1.7–7.7)
Neutrophils Relative %: 68 %
Platelets: 328 10*3/uL (ref 150–400)
RBC: 5.04 MIL/uL (ref 3.87–5.11)
RDW: 14.9 % (ref 11.5–15.5)
WBC: 8.4 10*3/uL (ref 4.0–10.5)
nRBC: 0 % (ref 0.0–0.2)

## 2021-03-05 LAB — COMPREHENSIVE METABOLIC PANEL
ALT: 14 U/L (ref 0–44)
AST: 22 U/L (ref 15–41)
Albumin: 4.8 g/dL (ref 3.5–5.0)
Alkaline Phosphatase: 75 U/L (ref 38–126)
Anion gap: 11 (ref 5–15)
BUN: 24 mg/dL — ABNORMAL HIGH (ref 8–23)
CO2: 28 mmol/L (ref 22–32)
Calcium: 10 mg/dL (ref 8.9–10.3)
Chloride: 94 mmol/L — ABNORMAL LOW (ref 98–111)
Creatinine, Ser: 1.56 mg/dL — ABNORMAL HIGH (ref 0.44–1.00)
GFR, Estimated: 37 mL/min — ABNORMAL LOW (ref >60)
Glucose, Bld: 104 mg/dL — ABNORMAL HIGH (ref 70–99)
Potassium: 3.4 mmol/L — ABNORMAL LOW (ref 3.5–5.1)
Sodium: 133 mmol/L — ABNORMAL LOW (ref 135–145)
Total Bilirubin: 1 mg/dL (ref 0.3–1.2)
Total Protein: 8.5 g/dL — ABNORMAL HIGH (ref 6.5–8.1)

## 2021-03-05 LAB — RESP PANEL BY RT-PCR (FLU A&B, COVID) ARPGX2
Influenza A by PCR: NEGATIVE
Influenza B by PCR: NEGATIVE
SARS Coronavirus 2 by RT PCR: POSITIVE — AB

## 2021-03-05 LAB — TROPONIN I (HIGH SENSITIVITY): Troponin I (High Sensitivity): 3 ng/L (ref <18)

## 2021-03-05 NOTE — ED Triage Notes (Signed)
Pt c/o dizziness, nausea, and BLE weakness x 1 week and bilateral abdominal pain x1 episode yesterday.   Pt reports similar episode this summer and was admitted for hyponatremia.

## 2021-03-05 NOTE — ED Notes (Addendum)
No answer when called for repeat vitals x3

## 2021-03-05 NOTE — ED Provider Notes (Signed)
Emergency Medicine Provider Triage Evaluation Note  Erin Lewis [place] , a 65 y.o. female  was evaluated in triage.  Pt complains of weakness and dizziness. Symptoms increasing over the week. History of hyponatremia with similar symptoms.   Review of Systems  Positive: Weakness, nausea, dizziness Negative: Fever  Physical Exam  Ht 5\' 5"  (1.651 m)    Wt 67.6 kg    BMI 24.79 kg/m  Gen:   Awake, no distress   Resp:  Normal effort  MSK:   Moves extremities without difficulty  Other:    Medical Decision Making  Medically screening exam initiated at 3:33 PM.  Appropriate orders placed.  Erin Lewis [place] was informed that the remainder of the evaluation will be completed by another provider, this initial triage assessment does not replace that evaluation, and the importance of remaining in the ED until their evaluation is complete.   Victorino Dike, FNP 03/05/21 1533    Harvest Dark, MD 03/06/21 [phone]

## 2021-06-15 ENCOUNTER — Other Ambulatory Visit: Payer: Self-pay

## 2021-06-15 ENCOUNTER — Emergency Department
Admission: EM | Admit: 2021-06-15 | Discharge: 2021-06-16 | Disposition: A | Discharge status: Home / Self Care | Payer: Medicare HMO | Attending: Emergency Medicine | Admitting: Emergency Medicine

## 2021-06-15 ENCOUNTER — Encounter: Payer: Self-pay | Admitting: Emergency Medicine

## 2021-06-15 ENCOUNTER — Encounter: Payer: Self-pay | Admitting: Intensive Care

## 2021-06-15 ENCOUNTER — Emergency Department: Payer: Medicare HMO

## 2021-06-15 ENCOUNTER — Ambulatory Visit: Admission: EM | Admit: 2021-06-15 | Discharge: 2021-06-15 | Payer: Medicare HMO

## 2021-06-15 DIAGNOSIS — M542 Cervicalgia: Secondary | ICD-10-CM | POA: Insufficient documentation

## 2021-06-15 DIAGNOSIS — R42 Dizziness and giddiness: Secondary | ICD-10-CM

## 2021-06-15 DIAGNOSIS — H9209 Otalgia, unspecified ear: Secondary | ICD-10-CM | POA: Insufficient documentation

## 2021-06-15 HISTORY — DX: Dizziness and giddiness: R42

## 2021-06-15 LAB — CBC
HCT: 45.2 % (ref 36.0–46.0)
Hemoglobin: 15.1 g/dL — ABNORMAL HIGH (ref 12.0–15.0)
MCH: 30.8 pg (ref 26.0–34.0)
MCHC: 33.4 g/dL (ref 30.0–36.0)
MCV: 92.1 fL (ref 80.0–100.0)
Platelets: 307 10*3/uL (ref 150–400)
RBC: 4.91 MIL/uL (ref 3.87–5.11)
RDW: 15.9 % — ABNORMAL HIGH (ref 11.5–15.5)
WBC: 8.7 10*3/uL (ref 4.0–10.5)
nRBC: 0 % (ref 0.0–0.2)

## 2021-06-15 LAB — BASIC METABOLIC PANEL
Anion gap: 11 (ref 5–15)
BUN: 18 mg/dL (ref 8–23)
CO2: 27 mmol/L (ref 22–32)
Calcium: 10.4 mg/dL — ABNORMAL HIGH (ref 8.9–10.3)
Chloride: 98 mmol/L (ref 98–111)
Creatinine, Ser: 1.14 mg/dL — ABNORMAL HIGH (ref 0.44–1.00)
GFR, Estimated: 53 mL/min — ABNORMAL LOW (ref >60)
Glucose, Bld: 89 mg/dL (ref 70–99)
Potassium: 3.9 mmol/L (ref 3.5–5.1)
Sodium: 136 mmol/L (ref 135–145)

## 2021-06-15 LAB — URINALYSIS, ROUTINE W REFLEX MICROSCOPIC
Bacteria, UA: NONE SEEN
Bilirubin Urine: NEGATIVE
Glucose, UA: NEGATIVE mg/dL
Ketones, ur: NEGATIVE mg/dL
Nitrite: NEGATIVE
Protein, ur: NEGATIVE mg/dL
Specific Gravity, Urine: 1.005 (ref 1.005–1.030)
pH: 5 (ref 5.0–8.0)

## 2021-06-15 MED ORDER — MECLIZINE HCL 25 MG PO TABS
25.0000 mg | ORAL_TABLET | Freq: Once | ORAL | Status: AC
  Administered 2021-06-15: 25 mg via ORAL
  Filled 2021-06-15: qty 1

## 2021-06-15 MED ORDER — MECLIZINE HCL 25 MG PO TABS: 25.0000 mg | ORAL_TABLET | Freq: Three times a day (TID) | ORAL | 0 refills | Status: AC | PRN

## 2021-06-15 NOTE — ED Notes (Signed)
Pt to MRI

## 2021-06-15 NOTE — ED Provider Notes (Signed)
? ?[facility] ?Provider Note ? ? ? Event Date/Time  ? First MD Initiated Contact with Patient 06/15/21 1758   ?  (approximate) ? ? ?History  ? ?Dizziness, Neck Pain, and Ear Pain ? ? ?HPI ? ?Erin Lewis is a 66 y.o. female  who, per discharge summary dated 09/06/2020 had admission for dizziness and feeling wobbly, who presents to the emergency department today because of concern for dizziness and neck pain. She states symptoms have been present for 1-2 weeks. She denies any trauma to her head. The patient says that her dizziness is worse when she moves around although will sometimes to be present when she is sitting still.  Her neck pain is located on both sides of her neck.  She denies any fevers.  She states that this does remind her of some of the dizziness she has had in the past. ? ?Physical Exam  ? ?Triage Vital Signs: ?ED Triage Vitals  ?Enc Vitals Group  ?   BP 06/15/21 1613 (!) 155/85  ?   Pulse Rate 06/15/21 1613 67  ?   Resp 06/15/21 1613 18  ?   Temp 06/15/21 1613 98.5 ?F (36.9 ?C)  ?   Temp Source 06/15/21 1613 Oral  ?   SpO2 06/15/21 1613 98 %  ?   Weight 06/15/21 1615 145 lb (65.8 kg)  ?   Height 06/15/21 1615 '5\' 5"'$  (1.651 m)  ?   Head Circumference --   ?   Peak Flow --   ?   Pain Score 06/15/21 1615 7  ? ?Most recent vital signs: ?Vitals:  ? 06/15/21 1613  ?BP: (!) 155/85  ?Pulse: 67  ?Resp: 18  ?Temp: 98.5 ?F (36.9 ?C)  ?SpO2: 98%  ? ?General: Awake, alert. ?CV:  Good peripheral perfusion. Regular rate and rhythm. ?Resp:  Normal effort. Lungs clear to auscultation. ?Abd:  No distention.  ? ? ?ED Results / Procedures / Treatments  ? ?Labs ?(all labs ordered are listed, but only abnormal results are displayed) ?Labs Reviewed  ?BASIC METABOLIC PANEL - Abnormal; Notable for the following components:  ?    Result Value  ? Creatinine, Ser 1.14 (*)   ? Calcium 10.4 (*)   ? GFR, Estimated 53 (*)   ? All other components within normal limits  ?CBC - Abnormal; Notable for the  following components:  ? Hemoglobin 15.1 (*)   ? RDW 15.9 (*)   ? All other components within normal limits  ?URINALYSIS, ROUTINE W REFLEX MICROSCOPIC - Abnormal; Notable for the following components:  ? Color, Urine STRAW (*)   ? APPearance CLEAR (*)   ? Hgb urine dipstick MODERATE (*)   ? Leukocytes,Ua SMALL (*)   ? All other components within normal limits  ? ? ? ?EKG ? ?INance Pear, attending physician, personally viewed and interpreted this EKG ? ?EKG Time: 1616 ?Rate: 66 ?Rhythm: normal sinus rhythm ?Axis: normal ?Intervals: qtc 415 ?QRS: narrow ?ST changes: no st elevation ?Impression: normal ekg ? ?RADIOLOGY ?I independently interpreted and visualized the MR brain. My interpretation: No large mass ?Radiology interpretation:  ?IMPRESSION:  ?Negative brain MRI.  No acute intracranial abnormality.  ?   ? ?MR cervical spine ?IMPRESSION:  ?1. No acute abnormality within the cervical spine.  ?2. Mild multilevel cervical spondylosis without significant spinal  ?stenosis.  ?3. 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  ?   ? ? ? ?  PROCEDURES: ? ?Critical Care performed: No ? ?Procedures ? ? ?MEDICATIONS ORDERED IN ED: ?Medications - No data to display ? ? ?IMPRESSION / MDM / ASSESSMENT AND PLAN / ED COURSE  ?I reviewed the triage vital signs and the nursing notes. ?             ?               ? ?Differential diagnosis includes, but is not limited to, stroke, vertigo, anemia, electrolyte abnormality. ? ?Patient presents to the emergency department today because of concerns for dizziness.  She is also complaining of neck pain.  Patient has had this dizziness in the past although she states it is worse now.  Somewhat unclear etiology on initial exam.  Patient also complaining of neck pain.  Given combination of neck pain and vertigo did obtain MRIs to evaluate for possible central lesion.  MRI  fortunately did not show any CVA or concerning findings in the cervical spine.  Did show some degenerative disease.  Patient did feel better after meclizine.  At this time I do think vertigo likely.  Discussed the results of the MRI with the patient.  Also discussed findings of degenerative disease.  Given that patient feels improvement and MRIs are negative I do not feel she necessitates inpatient mission at this time.  We will plan on discharging with meclizine and ENT follow-up. ? ?FINAL CLINICAL IMPRESSION(S) / ED DIAGNOSES  ? ?Final diagnoses:  ?Vertigo  ? ? ? ?Rx / DC Orders  ? ?ED Discharge Orders   ? ?      Ordered  ?  meclizine (ANTIVERT) 25 MG tablet  3 times daily PRN       ? 06/15/21 2351  ? ?  ?  ? ?  ? ? ? ?Note:  This document was prepared using Dragon voice recognition software and may include unintentional dictation errors. ? ?  ?Nance Pear, MD ?06/15/21 2354 ? ?

## 2021-06-15 NOTE — ED Provider Notes (Signed)
?[facility] ? ? ? ?CSN: [id number] ?Arrival date & time: 06/15/21  1512 ? ? ?  ? ?History   ?Chief Complaint ?Chief Complaint  ?Patient presents with  ? Neck Pain  ? Dizziness  ? Otalgia  ? ? ?HPI ?Erin Lewis is a 66 y.o. female presenting with neck pain and dizziness for 2 days.  History polysubstance abuse, CAD, anxiety, hyponatremia.  She has had multiple visits to the emergency department for dizziness, syncope, hyponatremia in the last year.  She states she has never met with an ENT for this.  Today she states the dizziness is so bad she cannot take it anymore.  Describes this as a sensation of room spinning, associated with ear pressure and nasal congestion.  Also with chest congestion, but no cough or shortness of breath.  She states that this feels similar to when she was hyponatremic in the past. ? ?HPI ? ?Past Medical History:  ?Diagnosis Date  ? Alcohol abuse   ? Anxiety   ? Carotid arterial disease ([place])   ? a. 09/2013 U/S: <50% bilat ICA stenosis.  ? CKD (chronic kidney disease), stage III ([place])   ? Depression   ? Hyperlipidemia   ? Hypertension   ? Polysubstance abuse ([place])   ? Tobacco abuse   ? Weakness   ? a. 12/2012 admit w/ L sided wkns-->No CVA; b. 12/2012 Echo: EF 60-65%, nl RV fxn.  ? ? ?Patient Active Problem List  ? Diagnosis Date Noted  ? Hypertensive urgency 09/05/2020  ? Acute hyponatremia 05/29/2019  ? Dizziness   ? Alcohol use disorder, moderate, dependence ([place]) 05/20/2018  ? Recurrent major depression-severe ([place]) 05/20/2018  ? Rectal bleeding 07/11/2016  ? Sensory deficit, left 03/11/2016  ? HTN (hypertension) 03/11/2016  ? Expressive aphasia 03/11/2016  ? Weakness of left lower extremity 03/11/2016  ? Major depressive disorder, recurrent, severe without psychotic features ([place]) 10/07/2014  ? Anxiety reaction 10/07/2014  ? ? ?Past Surgical History:  ?Procedure Laterality Date  ? [facility]BD[facility] [place]    ? x2  ? COLONOSCOPY WITH  PROPOFOL N/A 07/20/2016  ? Procedure: COLONOSCOPY WITH PROPOFOL;  Surgeon: Robert Bellow, MD;  Location: [facility] ENDOSCOPY;  Service: Endoscopy;  Laterality: N/A;  ? laproscopy    ? abdomen  ? SHOULDER ARTHROSCOPY W/ ROTATOR CUFF REPAIR    ? x2  ? TOTAL SHOULDER ARTHROPLASTY Right 6-7 years ago  ? ? ?OB History   ? ? Gravida  ?2  ? Para  ?2  ? Term  ?   ? Preterm  ?   ? AB  ?   ? Living  ?2  ?  ? ? SAB  ?   ? IAB  ?   ? Ectopic  ?   ? Multiple  ?   ? Live Births  ?   ?   ?  ? Obstetric Comments  ?Menstrual age: 9 ? ?Age 1st Pregnancy: 79 ?  ?  ? ?  ? ? ? ?Home Medications   ? ?Prior to Admission medications   ?Medication Sig Start Date End Date Taking? Authorizing Provider  ?amLODipine (NORVASC) 5 MG tablet Take 1 tablet (5 mg total) by mouth daily. 09/06/20 10/06/20  Elodia Florence., MD  ?aspirin EC 81 MG tablet Take 81 mg by mouth daily.    [provider]  ?atorvastatin (LIPITOR) 40 MG tablet Take 40 mg by mouth daily.  09/05/18  [provider]  ?cholecalciferol (VITAMIN D3) 25 MCG (1000 UT) tablet Take 1 tablet (1,000 Units total) by mouth daily. ?Patient taking differently: Take 1,000 Units by mouth every 7 (seven) days. On Tuesdays 05/23/18   Clapacs, Madie Reno, MD  ?diazepam (VALIUM) 10 MG tablet Take 10 mg by mouth 3 (three) times daily as needed. 09/01/20   [provider]  ?hydrochlorothiazide (HYDRODIURIL) 25 MG tablet Take 25 mg by mouth daily. 04/07/21   [provider]  ?hydrOXYzine (ATARAX/VISTARIL) 25 MG tablet Take 25 mg by mouth every 4 (four) hours as needed for sleep. 12/26/16   [provider]  ?losartan (COZAAR) 100 MG tablet Take 1 tablet (100 mg total) by mouth daily. 05/31/19   Lorella Nimrod, MD  ?meclizine (ANTIVERT) 25 MG tablet Take 1 tablet (25 mg total) by mouth 3 (three) times daily as needed for dizziness. 09/24/20   Vanessa Litchfield, MD  ?QUEtiapine (SEROQUEL) 50 MG tablet Take 50 mg by mouth daily. 08/17/20   [provider]  ?clonazePAM  (KLONOPIN) 0.5 MG tablet Take 0.5 mg by mouth 2 (two) times daily as needed for anxiety.  ?Patient not taking: Reported on 09/05/2020 05/05/18 09/06/20  [provider]  ? ? ?Family History ?Family History  ?Problem Relation Age of Onset  ? Diabetes Mother   ? Kidney disease Father   ? Diabetes Father   ? Heart attack Father   ? Colon polyps Father 36  ? Hypertension Other   ? Diabetes Other   ? Breast cancer Neg Hx   ? ? ?Social History ?Social History  ? ?Tobacco Use  ? Smoking status: Every Day  ?  Packs/day: 0.50  ?  Years: 45.00  ?  Pack years: 22.50  ?  Types: Cigarettes  ? Smokeless tobacco: Never  ? Tobacco comments:  ?  Currently smoking about 4 cigarettes/day but smoked more previously.  ?Vaping Use  ? Vaping Use: Never used  ?Substance Use Topics  ? Alcohol use: Yes  ?  Comment: Occasionally now; previously drank heavily.  ? Drug use: Yes  ?  Types: Marijuana  ?  Comment: Occasionaly smokes pot.  ? ? ? ?Allergies   ?Oxcarbazepine and Pork-derived products ? ? ?Review of Systems ?Review of Systems  ?Constitutional:  Negative for appetite change, chills and fever.  ?HENT:  Negative for congestion, ear pain, rhinorrhea, sinus pressure, sinus pain and sore throat.   ?Eyes:  Negative for redness and visual disturbance.  ?Respiratory:  Negative for cough, chest tightness, shortness of breath and wheezing.   ?Cardiovascular:  Negative for chest pain and palpitations.  ?Gastrointestinal:  Negative for abdominal pain, constipation, diarrhea, nausea and vomiting.  ?Genitourinary:  Negative for dysuria, frequency and urgency.  ?Musculoskeletal:  Negative for myalgias.  ?Neurological:  Positive for dizziness. Negative for weakness and headaches.  ?Psychiatric/Behavioral:  Negative for confusion.   ?All other systems reviewed and are negative. ? ? ?Physical Exam ?Triage Vital Signs ?ED Triage Vitals  ?Enc Vitals Group  ?   BP 06/15/21 1533 (!) 162/94  ?   Pulse Rate 06/15/21 1533 72  ?   Resp 06/15/21 1533 18  ?    Temp 06/15/21 1533 98.8 ?F (37.1 ?C)  ?   Temp Source 06/15/21 1533 Oral  ?   SpO2 06/15/21 1533 98 %  ?   Weight --   ?   Height --   ?   Head Circumference --   ?   Peak Flow --   ?  Pain Score 06/15/21 1530 5  ?   Pain Loc --   ?   Pain Edu? --   ?   Excl. in [place]? --   ? ?No data found. ? ?Updated Vital Signs ?BP (!) 162/94 (BP Location: Left Arm)   Pulse 72   Temp 98.8 ?F (37.1 ?C) (Oral)   Resp 18   SpO2 98%  ? ?Visual Acuity ?Right Eye Distance:   ?Left Eye Distance:   ?Bilateral Distance:   ? ?Right Eye Near:   ?Left Eye Near:    ?Bilateral Near:    ? ?Physical Exam ?Vitals reviewed.  ?Constitutional:   ?   General: She is not in acute distress. ?   Appearance: Normal appearance. She is not ill-appearing.  ?HENT:  ?   Head: Normocephalic and atraumatic.  ?Eyes:  ?   Extraocular Movements: Extraocular movements intact.  ?   Pupils: Pupils are equal, round, and reactive to light.  ?Cardiovascular:  ?   Rate and Rhythm: Normal rate and regular rhythm.  ?   Heart sounds: Normal heart sounds.  ?Pulmonary:  ?   Effort: Pulmonary effort is normal.  ?   Breath sounds: Normal breath sounds. No wheezing, rhonchi or rales.  ?Musculoskeletal:  ?   Cervical back: Normal range of motion and neck supple. No rigidity.  ?Lymphadenopathy:  ?   Cervical: No cervical adenopathy.  ?Skin: ?   Capillary Refill: Capillary refill takes less than 2 seconds.  ?Neurological:  ?   General: No focal deficit present.  ?   Mental Status: She is alert and oriented to person, place, and time. Mental status is at baseline.  ?   Cranial Nerves: No cranial nerve deficit or facial asymmetry.  ?   Sensory: Sensation is intact. No sensory deficit.  ?   Motor: Motor function is intact. No weakness.  ?   Coordination: Coordination is intact. Coordination normal.  ?   Gait: Gait is intact. Gait normal.  ?   Comments: PERRLA, EOMI. CN 2-12 intact. Strength and sensation grossly intact. Fingers to thumb intact. Unable to appreciate rhomberg and  pronator drift given extent of dizziness   ?Psychiatric:     ?   Mood and Affect: Mood normal.     ?   Behavior: Behavior normal.     ?   Thought Content: Thought content normal.     ?   Judgment: Judgment norm

## 2021-06-15 NOTE — Discharge Instructions (Addendum)
-  Head to ED. Have your family member or friend drive you. Stop and call 911 if symptoms get worse on the way. ?

## 2021-06-15 NOTE — ED Triage Notes (Signed)
Patient c/o dizziness X1 week. Reports neck pain and bilateral ear pain started X1 week ago. UC sent patient to ER. Denies N/V/D ?

## 2021-06-15 NOTE — ED Triage Notes (Signed)
Pt presents with neck pain and dizziness x 2 days. Pt denies any injury. She also c/o ear pain and chest congestion x 2 days ?

## 2021-06-15 NOTE — Discharge Instructions (Signed)
Please seek medical attention for any high fevers, chest pain, shortness of breath, change in behavior, persistent vomiting, bloody stool or any other new or concerning symptoms.  

## 2021-06-15 NOTE — ED Notes (Signed)
Patient is being discharged from the Urgent Care and sent to the Emergency Department via car . Per [name] her son, patient is in need of higher level of care due to dizziness and neck pain. Patient is aware and verbalizes understanding of plan of care.  ?Vitals:  ? 06/15/21 1533  ?BP: (!) 162/94  ?Pulse: 72  ?Resp: 18  ?Temp: 98.8 ?F (37.1 ?C)  ?SpO2: 98%  ?  ?

## 2021-09-14 ENCOUNTER — Other Ambulatory Visit: Payer: Self-pay | Admitting: Internal Medicine

## 2021-09-14 DIAGNOSIS — Z1231 Encounter for screening mammogram for malignant neoplasm of breast: Secondary | ICD-10-CM

## 2021-10-11 ENCOUNTER — Ambulatory Visit
Admission: RE | Admit: 2021-10-11 | Discharge: 2021-10-11 | Disposition: A | Discharge status: Home / Self Care | Payer: Medicare HMO | Source: Ambulatory Visit | Attending: Internal Medicine | Admitting: Internal Medicine

## 2021-10-11 DIAGNOSIS — Z1231 Encounter for screening mammogram for malignant neoplasm of breast: Secondary | ICD-10-CM | POA: Diagnosis present

## 2022-01-11 ENCOUNTER — Ambulatory Visit: Payer: Self-pay | Admitting: *Deleted

## 2022-01-11 NOTE — Telephone Encounter (Signed)
Reason for Disposition  [1] COVID-19 diagnosed by positive lab test (e.g., PCR, rapid self-test kit) AND [2] mild symptoms (e.g., cough, fever, others) AND [0] no complications or SOB  Answer Assessment - Initial Assessment Questions 1. COVID-19 DIAGNOSIS: "How do you know that you have COVID?" (e.g., positive lab test or self-test, diagnosed by doctor or NP/PA, symptoms after exposure).     Covid positive on home test. 2. COVID-19 EXPOSURE: "Was there any known exposure to COVID before the symptoms began?" CDC Definition of close contact: within 6 feet (2 meters) for a total of 15 minutes or more over a 24-hour period.      unknown 3. ONSET: "When did the COVID-19 symptoms start?"      Started yesterday coughing 4. WORST SYMPTOM: "What is your worst symptom?" (e.g., cough, fever, shortness of breath, muscle aches)     coughing 5. COUGH: "Do you have a cough?" If Yes, ask: "How bad is the cough?"       Yes   Dry cough 6. FEVER: "Do you have a fever?" If Yes, ask: "What is your temperature, how was it measured, and when did it start?"     Tightness in chest, runny nose headache, chills.  I don't feel hot.  7. RESPIRATORY STATUS: "Describe your breathing?" (e.g., normal; shortness of breath, wheezing, unable to speak)      Chest tightness  No shortness of breath 8. BETTER-SAME-WORSE: "Are you getting better, staying the same or getting worse compared to yesterday?"  If getting worse, ask, "In what way?"     Not asked 9. OTHER SYMPTOMS: "Do you have any other symptoms?"  (e.g., chills, fatigue, headache, loss of smell or taste, muscle pain, sore throat)     Not  asked  10. HIGH RISK DISEASE: "Do you have any chronic medical problems?" (e.g., asthma, heart or lung disease, weak immune system, obesity, etc.)       Denies high risk factors 11. VACCINE: "Have you had the COVID-19 vaccine?" If Yes, ask: "Which one, how many shots, when did you get it?"       Yes and flu shot 12. PREGNANCY: "Is  there any chance you are pregnant?" "When was your last menstrual period?"       Not asked due to age 66. O2 SATURATION MONITOR:  "Do you use an oxygen saturation monitor (pulse oximeter) at home?" If Yes, ask "What is your reading (oxygen level) today?" "What is your usual oxygen saturation reading?" (e.g., 95%)       N/A  Protocols used: Coronavirus (COVID-19) Diagnosed or Suspected-A-AH

## 2022-01-11 NOTE — Telephone Encounter (Signed)
  Chief Complaint: Covid positive Symptoms: frequent dry cough, chills, runny nose, headache and tightness in my chest  Denies cardiac history though going for a stress test on Monday.   "They check me every so many years" Frequency: Symptoms started yesterday (Monday) Pertinent Negatives: Patient denies shortness of breath or sweating or N/V/D Disposition: '[]'$ ED /'[]'$ Urgent Care (no appt availability in office) / '[]'$ Appointment(In office/virtual)/ '[]'$  [facility]/ '[]'$ Home Care/ '[]'$ Refused Recommended Disposition /'[]'$ [facility]/ '[x]'$  Follow-up with PCP Additional Notes: She is going to call her PCP and discuss antiviral medication with him.   She is also going to call her cardiologist now and see what they want her to do about coming in for the stress test on Monday since she has Covid.  I answered her questions and she thanked me for my help.
